# Patient Record
Sex: Male | Born: 1965 | Race: White | Hispanic: No | Smoking: Current every day smoker
Health system: Southern US, Community
[De-identification: ages and names within clinical notes are randomized; demographics above are authoritative.]

## PROBLEM LIST (undated history)

## (undated) DIAGNOSIS — K649 Unspecified hemorrhoids: Secondary | ICD-10-CM

## (undated) DIAGNOSIS — I10 Essential (primary) hypertension: Secondary | ICD-10-CM

## (undated) DIAGNOSIS — Z720 Tobacco use: Secondary | ICD-10-CM

## (undated) DIAGNOSIS — E785 Hyperlipidemia, unspecified: Secondary | ICD-10-CM

## (undated) DIAGNOSIS — F1910 Other psychoactive substance abuse, uncomplicated: Secondary | ICD-10-CM

## (undated) DIAGNOSIS — F319 Bipolar disorder, unspecified: Secondary | ICD-10-CM

## (undated) DIAGNOSIS — G4730 Sleep apnea, unspecified: Secondary | ICD-10-CM

## (undated) DIAGNOSIS — F4310 Post-traumatic stress disorder, unspecified: Secondary | ICD-10-CM

## (undated) DIAGNOSIS — J302 Other seasonal allergic rhinitis: Secondary | ICD-10-CM

## (undated) HISTORY — PX: COLONOSCOPY: SHX174

## (undated) HISTORY — DX: Essential (primary) hypertension: I10

## (undated) HISTORY — DX: Unspecified hemorrhoids: K64.9

## (undated) HISTORY — PX: KNEE SURGERY: SHX244

## (undated) HISTORY — PX: CHOLECYSTECTOMY: SHX55

## (undated) HISTORY — DX: Hyperlipidemia, unspecified: E78.5

## (undated) HISTORY — DX: Other psychoactive substance abuse, uncomplicated: F19.10

## (undated) HISTORY — PX: BACK SURGERY: SHX140

## (undated) HISTORY — DX: Other seasonal allergic rhinitis: J30.2

## (undated) HISTORY — PX: CYSTECTOMY: SUR359

## (undated) HISTORY — DX: Post-traumatic stress disorder, unspecified: F43.10

## (undated) HISTORY — DX: Tobacco use: Z72.0

## (undated) HISTORY — PX: FOOT SURGERY: SHX648

---

## 2008-09-09 ENCOUNTER — Emergency Department: Payer: Self-pay | Admitting: Emergency Medicine

## 2011-03-15 DIAGNOSIS — Z79899 Other long term (current) drug therapy: Secondary | ICD-10-CM | POA: Diagnosis not present

## 2011-03-16 ENCOUNTER — Emergency Department: Payer: Self-pay | Admitting: Emergency Medicine

## 2011-03-16 DIAGNOSIS — S93409A Sprain of unspecified ligament of unspecified ankle, initial encounter: Secondary | ICD-10-CM | POA: Diagnosis not present

## 2011-03-16 DIAGNOSIS — M129 Arthropathy, unspecified: Secondary | ICD-10-CM | POA: Diagnosis not present

## 2011-03-16 DIAGNOSIS — M545 Low back pain, unspecified: Secondary | ICD-10-CM | POA: Diagnosis not present

## 2011-03-16 DIAGNOSIS — S93609A Unspecified sprain of unspecified foot, initial encounter: Secondary | ICD-10-CM | POA: Diagnosis not present

## 2011-03-16 DIAGNOSIS — G8929 Other chronic pain: Secondary | ICD-10-CM | POA: Diagnosis not present

## 2011-03-16 DIAGNOSIS — M79609 Pain in unspecified limb: Secondary | ICD-10-CM | POA: Diagnosis not present

## 2011-04-01 DIAGNOSIS — F988 Other specified behavioral and emotional disorders with onset usually occurring in childhood and adolescence: Secondary | ICD-10-CM | POA: Diagnosis not present

## 2011-04-01 DIAGNOSIS — F319 Bipolar disorder, unspecified: Secondary | ICD-10-CM | POA: Diagnosis not present

## 2011-04-01 DIAGNOSIS — F4310 Post-traumatic stress disorder, unspecified: Secondary | ICD-10-CM | POA: Diagnosis not present

## 2011-04-01 DIAGNOSIS — Z79899 Other long term (current) drug therapy: Secondary | ICD-10-CM | POA: Diagnosis not present

## 2011-06-04 DIAGNOSIS — Z79899 Other long term (current) drug therapy: Secondary | ICD-10-CM | POA: Diagnosis not present

## 2011-06-04 DIAGNOSIS — F4310 Post-traumatic stress disorder, unspecified: Secondary | ICD-10-CM | POA: Diagnosis not present

## 2011-06-04 DIAGNOSIS — F988 Other specified behavioral and emotional disorders with onset usually occurring in childhood and adolescence: Secondary | ICD-10-CM | POA: Diagnosis not present

## 2011-06-04 DIAGNOSIS — F319 Bipolar disorder, unspecified: Secondary | ICD-10-CM | POA: Diagnosis not present

## 2011-07-03 DIAGNOSIS — F329 Major depressive disorder, single episode, unspecified: Secondary | ICD-10-CM | POA: Diagnosis not present

## 2011-07-03 DIAGNOSIS — Z0000 Encounter for general adult medical examination without abnormal findings: Secondary | ICD-10-CM | POA: Diagnosis not present

## 2011-07-03 DIAGNOSIS — R5381 Other malaise: Secondary | ICD-10-CM | POA: Diagnosis not present

## 2011-07-03 DIAGNOSIS — Z125 Encounter for screening for malignant neoplasm of prostate: Secondary | ICD-10-CM | POA: Diagnosis not present

## 2011-07-03 DIAGNOSIS — Z6829 Body mass index (BMI) 29.0-29.9, adult: Secondary | ICD-10-CM | POA: Diagnosis not present

## 2011-07-03 DIAGNOSIS — Z13220 Encounter for screening for lipoid disorders: Secondary | ICD-10-CM | POA: Diagnosis not present

## 2011-07-03 DIAGNOSIS — R5383 Other fatigue: Secondary | ICD-10-CM | POA: Diagnosis not present

## 2011-07-03 DIAGNOSIS — Z136 Encounter for screening for cardiovascular disorders: Secondary | ICD-10-CM | POA: Diagnosis not present

## 2011-07-09 DIAGNOSIS — Z5181 Encounter for therapeutic drug level monitoring: Secondary | ICD-10-CM | POA: Diagnosis not present

## 2011-07-09 DIAGNOSIS — F4310 Post-traumatic stress disorder, unspecified: Secondary | ICD-10-CM | POA: Diagnosis not present

## 2011-07-09 DIAGNOSIS — F259 Schizoaffective disorder, unspecified: Secondary | ICD-10-CM | POA: Diagnosis not present

## 2011-07-09 DIAGNOSIS — Z79899 Other long term (current) drug therapy: Secondary | ICD-10-CM | POA: Diagnosis not present

## 2011-07-29 DIAGNOSIS — Z79899 Other long term (current) drug therapy: Secondary | ICD-10-CM | POA: Diagnosis not present

## 2011-08-07 DIAGNOSIS — Z79899 Other long term (current) drug therapy: Secondary | ICD-10-CM | POA: Diagnosis not present

## 2011-08-19 DIAGNOSIS — Z79899 Other long term (current) drug therapy: Secondary | ICD-10-CM | POA: Diagnosis not present

## 2011-09-05 DIAGNOSIS — F988 Other specified behavioral and emotional disorders with onset usually occurring in childhood and adolescence: Secondary | ICD-10-CM | POA: Diagnosis not present

## 2011-09-05 DIAGNOSIS — F319 Bipolar disorder, unspecified: Secondary | ICD-10-CM | POA: Diagnosis not present

## 2011-09-05 DIAGNOSIS — F4310 Post-traumatic stress disorder, unspecified: Secondary | ICD-10-CM | POA: Diagnosis not present

## 2011-09-06 DIAGNOSIS — Z79899 Other long term (current) drug therapy: Secondary | ICD-10-CM | POA: Diagnosis not present

## 2011-10-02 DIAGNOSIS — Z79899 Other long term (current) drug therapy: Secondary | ICD-10-CM | POA: Diagnosis not present

## 2011-11-08 DIAGNOSIS — Z79899 Other long term (current) drug therapy: Secondary | ICD-10-CM | POA: Diagnosis not present

## 2011-11-08 DIAGNOSIS — F4310 Post-traumatic stress disorder, unspecified: Secondary | ICD-10-CM | POA: Diagnosis not present

## 2011-11-08 DIAGNOSIS — F319 Bipolar disorder, unspecified: Secondary | ICD-10-CM | POA: Diagnosis not present

## 2011-11-08 DIAGNOSIS — F988 Other specified behavioral and emotional disorders with onset usually occurring in childhood and adolescence: Secondary | ICD-10-CM | POA: Diagnosis not present

## 2011-12-11 DIAGNOSIS — Z79899 Other long term (current) drug therapy: Secondary | ICD-10-CM | POA: Diagnosis not present

## 2011-12-31 DIAGNOSIS — F988 Other specified behavioral and emotional disorders with onset usually occurring in childhood and adolescence: Secondary | ICD-10-CM | POA: Diagnosis not present

## 2011-12-31 DIAGNOSIS — Z79899 Other long term (current) drug therapy: Secondary | ICD-10-CM | POA: Diagnosis not present

## 2011-12-31 DIAGNOSIS — F4310 Post-traumatic stress disorder, unspecified: Secondary | ICD-10-CM | POA: Diagnosis not present

## 2011-12-31 DIAGNOSIS — F319 Bipolar disorder, unspecified: Secondary | ICD-10-CM | POA: Diagnosis not present

## 2012-01-01 DIAGNOSIS — Z79899 Other long term (current) drug therapy: Secondary | ICD-10-CM | POA: Diagnosis not present

## 2012-01-27 ENCOUNTER — Ambulatory Visit: Payer: Self-pay | Admitting: Family Medicine

## 2012-01-27 DIAGNOSIS — M2550 Pain in unspecified joint: Secondary | ICD-10-CM | POA: Diagnosis not present

## 2012-01-27 DIAGNOSIS — F4310 Post-traumatic stress disorder, unspecified: Secondary | ICD-10-CM | POA: Diagnosis not present

## 2012-01-27 DIAGNOSIS — K625 Hemorrhage of anus and rectum: Secondary | ICD-10-CM | POA: Diagnosis not present

## 2012-01-27 DIAGNOSIS — E785 Hyperlipidemia, unspecified: Secondary | ICD-10-CM | POA: Diagnosis not present

## 2012-01-27 DIAGNOSIS — Z23 Encounter for immunization: Secondary | ICD-10-CM | POA: Diagnosis not present

## 2012-01-27 DIAGNOSIS — M169 Osteoarthritis of hip, unspecified: Secondary | ICD-10-CM | POA: Diagnosis not present

## 2012-01-27 DIAGNOSIS — M24159 Other articular cartilage disorders, unspecified hip: Secondary | ICD-10-CM | POA: Diagnosis not present

## 2012-04-20 DIAGNOSIS — F319 Bipolar disorder, unspecified: Secondary | ICD-10-CM | POA: Diagnosis not present

## 2012-04-20 DIAGNOSIS — Z79899 Other long term (current) drug therapy: Secondary | ICD-10-CM | POA: Diagnosis not present

## 2012-06-01 DIAGNOSIS — E785 Hyperlipidemia, unspecified: Secondary | ICD-10-CM | POA: Diagnosis not present

## 2012-06-01 DIAGNOSIS — I10 Essential (primary) hypertension: Secondary | ICD-10-CM | POA: Diagnosis not present

## 2012-06-01 DIAGNOSIS — F17200 Nicotine dependence, unspecified, uncomplicated: Secondary | ICD-10-CM | POA: Diagnosis not present

## 2012-06-18 DIAGNOSIS — M898X9 Other specified disorders of bone, unspecified site: Secondary | ICD-10-CM | POA: Diagnosis not present

## 2012-06-18 DIAGNOSIS — M2040 Other hammer toe(s) (acquired), unspecified foot: Secondary | ICD-10-CM | POA: Diagnosis not present

## 2012-06-25 DIAGNOSIS — L219 Seborrheic dermatitis, unspecified: Secondary | ICD-10-CM | POA: Diagnosis not present

## 2012-06-25 DIAGNOSIS — D179 Benign lipomatous neoplasm, unspecified: Secondary | ICD-10-CM | POA: Diagnosis not present

## 2012-06-25 DIAGNOSIS — D170 Benign lipomatous neoplasm of skin and subcutaneous tissue of head, face and neck: Secondary | ICD-10-CM | POA: Diagnosis not present

## 2012-07-06 ENCOUNTER — Ambulatory Visit: Payer: Self-pay | Admitting: Podiatry

## 2012-07-06 DIAGNOSIS — F319 Bipolar disorder, unspecified: Secondary | ICD-10-CM | POA: Diagnosis not present

## 2012-07-06 DIAGNOSIS — F17200 Nicotine dependence, unspecified, uncomplicated: Secondary | ICD-10-CM | POA: Diagnosis not present

## 2012-07-06 DIAGNOSIS — M2040 Other hammer toe(s) (acquired), unspecified foot: Secondary | ICD-10-CM | POA: Diagnosis not present

## 2012-07-06 DIAGNOSIS — M898X9 Other specified disorders of bone, unspecified site: Secondary | ICD-10-CM | POA: Diagnosis not present

## 2012-07-06 DIAGNOSIS — K219 Gastro-esophageal reflux disease without esophagitis: Secondary | ICD-10-CM | POA: Diagnosis not present

## 2012-07-06 DIAGNOSIS — I10 Essential (primary) hypertension: Secondary | ICD-10-CM | POA: Diagnosis not present

## 2012-07-06 DIAGNOSIS — Z01812 Encounter for preprocedural laboratory examination: Secondary | ICD-10-CM | POA: Diagnosis not present

## 2012-07-06 LAB — CBC WITH DIFFERENTIAL/PLATELET
Basophil #: 0.1 10*3/uL (ref 0.0–0.1)
Basophil %: 0.9 %
Lymphocyte #: 3.4 10*3/uL (ref 1.0–3.6)
MCH: 31.6 pg (ref 26.0–34.0)
MCHC: 34.6 g/dL (ref 32.0–36.0)
MCV: 91 fL (ref 80–100)
Monocyte #: 0.8 x10 3/mm (ref 0.2–1.0)
Monocyte %: 7.6 %
Neutrophil #: 6.1 10*3/uL (ref 1.4–6.5)
Neutrophil %: 58 %
Platelet: 324 10*3/uL (ref 150–440)
RDW: 13.1 % (ref 11.5–14.5)

## 2012-07-10 ENCOUNTER — Ambulatory Visit: Payer: Self-pay | Admitting: Podiatry

## 2012-07-10 DIAGNOSIS — K219 Gastro-esophageal reflux disease without esophagitis: Secondary | ICD-10-CM | POA: Diagnosis not present

## 2012-07-10 DIAGNOSIS — F319 Bipolar disorder, unspecified: Secondary | ICD-10-CM | POA: Diagnosis not present

## 2012-07-10 DIAGNOSIS — M2040 Other hammer toe(s) (acquired), unspecified foot: Secondary | ICD-10-CM | POA: Diagnosis not present

## 2012-07-10 DIAGNOSIS — I10 Essential (primary) hypertension: Secondary | ICD-10-CM | POA: Diagnosis not present

## 2012-07-10 DIAGNOSIS — F4310 Post-traumatic stress disorder, unspecified: Secondary | ICD-10-CM | POA: Diagnosis not present

## 2012-07-10 DIAGNOSIS — F17200 Nicotine dependence, unspecified, uncomplicated: Secondary | ICD-10-CM | POA: Diagnosis not present

## 2012-07-10 DIAGNOSIS — M898X9 Other specified disorders of bone, unspecified site: Secondary | ICD-10-CM | POA: Diagnosis not present

## 2012-09-02 DIAGNOSIS — E785 Hyperlipidemia, unspecified: Secondary | ICD-10-CM | POA: Diagnosis not present

## 2012-09-02 DIAGNOSIS — D179 Benign lipomatous neoplasm, unspecified: Secondary | ICD-10-CM | POA: Diagnosis not present

## 2012-09-02 DIAGNOSIS — I10 Essential (primary) hypertension: Secondary | ICD-10-CM | POA: Diagnosis not present

## 2012-09-02 DIAGNOSIS — K625 Hemorrhage of anus and rectum: Secondary | ICD-10-CM | POA: Diagnosis not present

## 2013-03-16 IMAGING — CR RIGHT HIP - COMPLETE 2+ VIEW
1 series · 2 of 2 positions shown · non-contrast
Comparison: none

REASON FOR EXAM: pain in joint arthraigia
COMMENTS:

PROCEDURE:     ZBIGNIEW - ZBIGNIEW HIP RIGHT COMPLETE  - January 27, 2012 [DATE]
RESULT:     Comparison: None.

[Series 1: ap · 0.17mm/px · 2 of 2 slices shown]
[im 1/2]
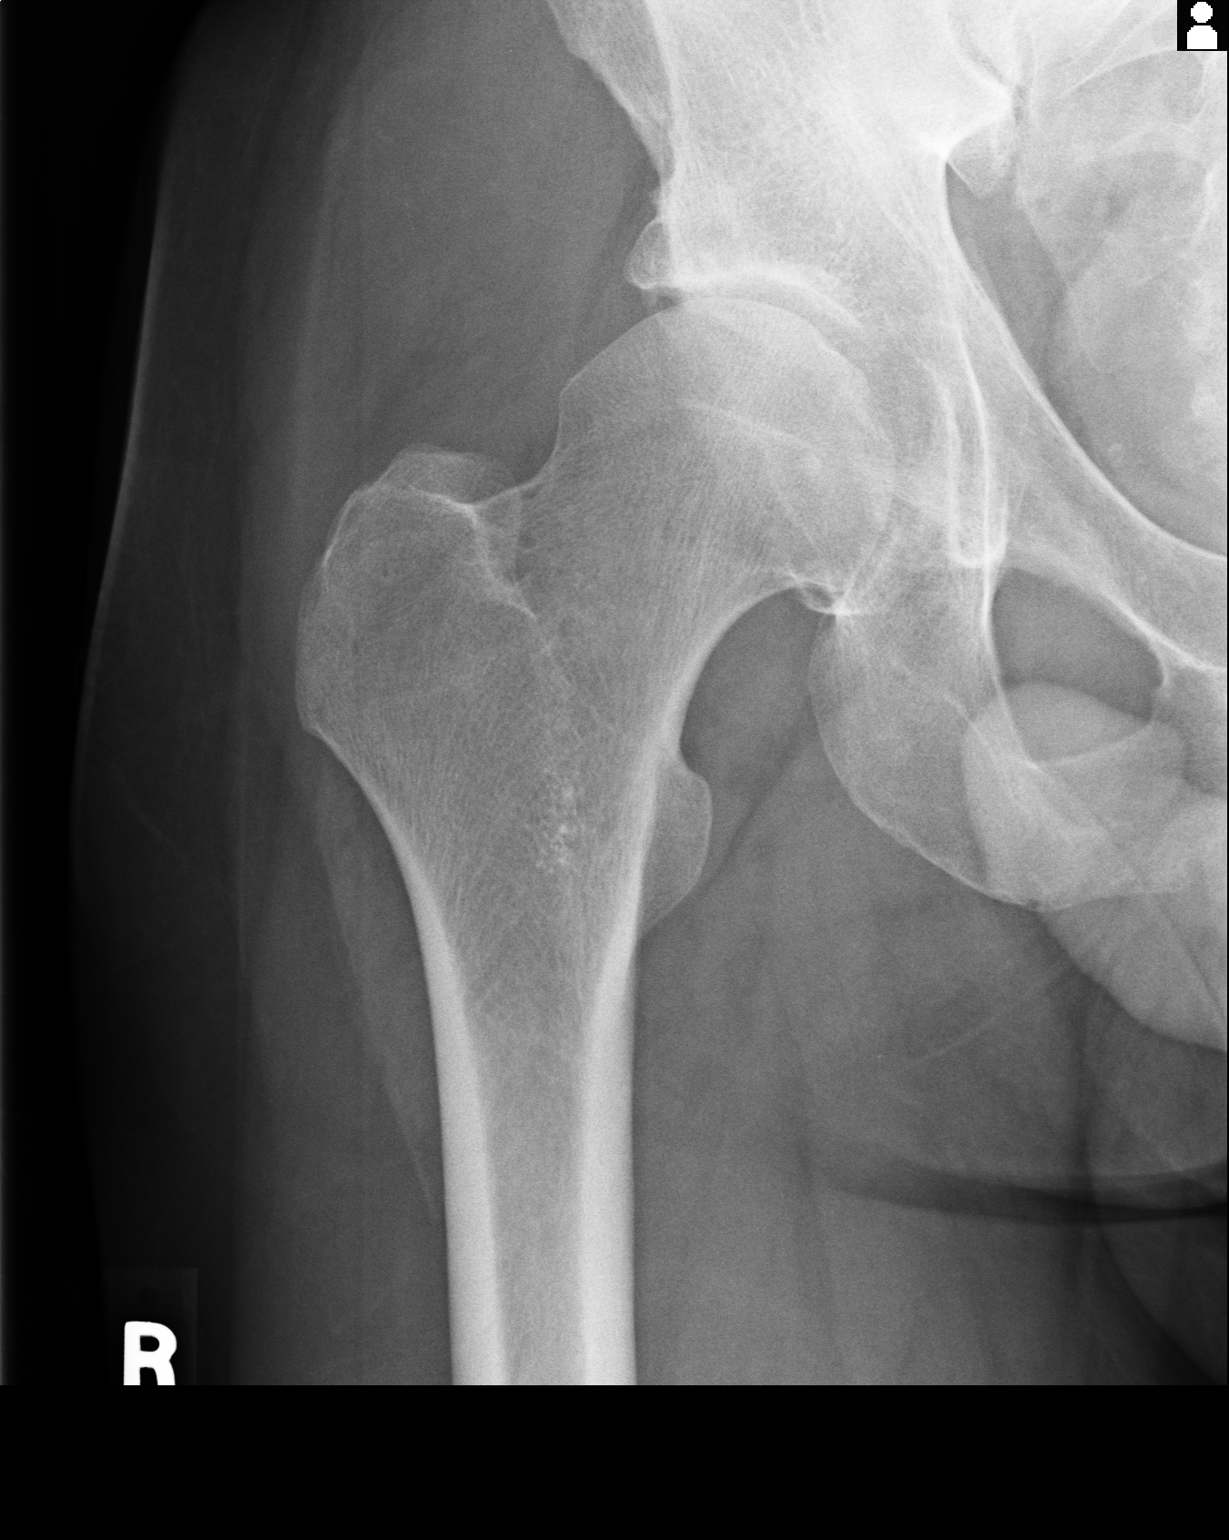
[im 2/2]
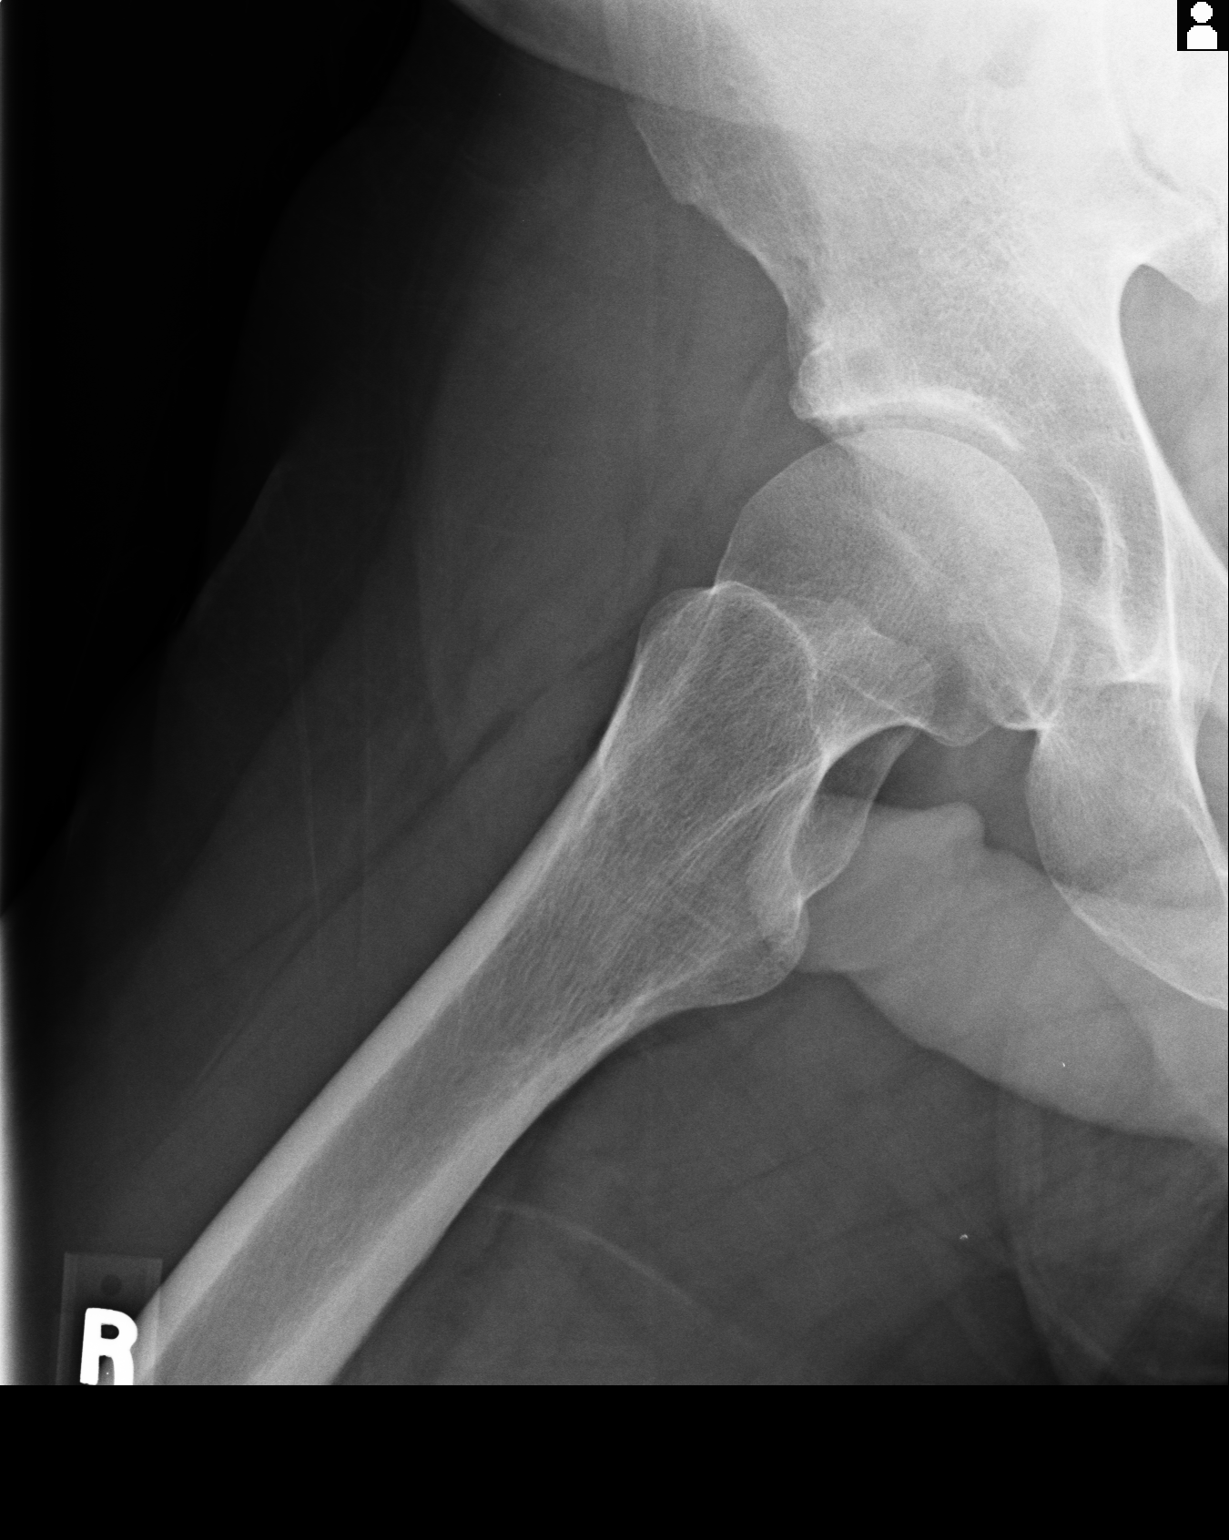

[2 of 2 positions shown; findings below may reference images not displayed]

FINDINGS: There is mild osteophytosis of the right femoral head. There is minimal
joint space loss of the right hip.
IMPRESSION: Mild degenerative change of the right hip.

[REDACTED]

## 2013-09-20 DIAGNOSIS — G8929 Other chronic pain: Secondary | ICD-10-CM | POA: Diagnosis not present

## 2013-09-20 DIAGNOSIS — R197 Diarrhea, unspecified: Secondary | ICD-10-CM | POA: Diagnosis not present

## 2013-09-20 DIAGNOSIS — F4310 Post-traumatic stress disorder, unspecified: Secondary | ICD-10-CM | POA: Diagnosis not present

## 2013-09-20 DIAGNOSIS — M549 Dorsalgia, unspecified: Secondary | ICD-10-CM | POA: Diagnosis not present

## 2013-10-07 ENCOUNTER — Ambulatory Visit: Payer: Self-pay | Admitting: Urgent Care

## 2013-10-07 DIAGNOSIS — I700 Atherosclerosis of aorta: Secondary | ICD-10-CM | POA: Diagnosis not present

## 2013-10-07 DIAGNOSIS — R197 Diarrhea, unspecified: Secondary | ICD-10-CM | POA: Diagnosis not present

## 2013-10-07 DIAGNOSIS — R109 Unspecified abdominal pain: Secondary | ICD-10-CM | POA: Diagnosis not present

## 2013-10-07 DIAGNOSIS — M47817 Spondylosis without myelopathy or radiculopathy, lumbosacral region: Secondary | ICD-10-CM | POA: Diagnosis not present

## 2013-10-07 LAB — COMPREHENSIVE METABOLIC PANEL
ALBUMIN: 3.3 g/dL — AB (ref 3.4–5.0)
ALK PHOS: 103 U/L
ALT: 26 U/L
ANION GAP: 5 — AB (ref 7–16)
BUN: 5 mg/dL — ABNORMAL LOW (ref 7–18)
Bilirubin,Total: 0.4 mg/dL (ref 0.2–1.0)
Calcium, Total: 8.7 mg/dL (ref 8.5–10.1)
Chloride: 108 mmol/L — ABNORMAL HIGH (ref 98–107)
Co2: 29 mmol/L (ref 21–32)
Creatinine: 0.95 mg/dL (ref 0.60–1.30)
EGFR (African American): >60
EGFR (Non-African Amer.): >60
Glucose: 91 mg/dL (ref 65–99)
Osmolality: 280 (ref 275–301)
POTASSIUM: 4.1 mmol/L (ref 3.5–5.1)
SGOT(AST): 22 U/L (ref 15–37)
Sodium: 142 mmol/L (ref 136–145)
Total Protein: 7.3 g/dL (ref 6.4–8.2)

## 2013-10-07 LAB — CBC WITH DIFFERENTIAL/PLATELET
Basophil #: 0.1 10*3/uL (ref 0.0–0.1)
Basophil %: 1.2 %
EOS ABS: 0.2 10*3/uL (ref 0.0–0.7)
Eosinophil %: 1.7 %
HCT: 42.2 % (ref 40.0–52.0)
HGB: 13.9 g/dL (ref 13.0–18.0)
LYMPHS ABS: 3.1 10*3/uL (ref 1.0–3.6)
Lymphocyte %: 33.2 %
MCH: 30 pg (ref 26.0–34.0)
MCHC: 33 g/dL (ref 32.0–36.0)
MCV: 91 fL (ref 80–100)
MONO ABS: 0.6 x10 3/mm (ref 0.2–1.0)
Monocyte %: 6.3 %
NEUTROS ABS: 5.5 10*3/uL (ref 1.4–6.5)
Neutrophil %: 57.6 %
Platelet: 328 10*3/uL (ref 150–440)
RBC: 4.64 10*6/uL (ref 4.40–5.90)
RDW: 14.3 % (ref 11.5–14.5)
WBC: 9.5 10*3/uL (ref 3.8–10.6)

## 2013-10-07 LAB — TSH: THYROID STIMULATING HORM: 1.02 u[IU]/mL

## 2013-10-07 LAB — LIPASE, BLOOD: Lipase: 109 U/L (ref 73–393)

## 2013-10-18 DIAGNOSIS — M549 Dorsalgia, unspecified: Secondary | ICD-10-CM | POA: Diagnosis not present

## 2013-10-18 DIAGNOSIS — G8929 Other chronic pain: Secondary | ICD-10-CM | POA: Diagnosis not present

## 2013-10-18 DIAGNOSIS — T148 Other injury of unspecified body region: Secondary | ICD-10-CM | POA: Diagnosis not present

## 2013-11-17 DIAGNOSIS — R079 Chest pain, unspecified: Secondary | ICD-10-CM | POA: Diagnosis not present

## 2013-11-17 DIAGNOSIS — F4310 Post-traumatic stress disorder, unspecified: Secondary | ICD-10-CM | POA: Diagnosis not present

## 2013-11-17 DIAGNOSIS — E785 Hyperlipidemia, unspecified: Secondary | ICD-10-CM | POA: Diagnosis not present

## 2013-11-17 DIAGNOSIS — I10 Essential (primary) hypertension: Secondary | ICD-10-CM | POA: Diagnosis not present

## 2013-11-18 DIAGNOSIS — Z6828 Body mass index (BMI) 28.0-28.9, adult: Secondary | ICD-10-CM | POA: Diagnosis not present

## 2013-11-18 DIAGNOSIS — M545 Low back pain, unspecified: Secondary | ICD-10-CM | POA: Diagnosis not present

## 2013-12-02 DIAGNOSIS — M549 Dorsalgia, unspecified: Secondary | ICD-10-CM | POA: Diagnosis not present

## 2013-12-09 DIAGNOSIS — M545 Low back pain: Secondary | ICD-10-CM | POA: Diagnosis not present

## 2013-12-16 ENCOUNTER — Encounter: Payer: Self-pay | Admitting: *Deleted

## 2013-12-17 ENCOUNTER — Ambulatory Visit (INDEPENDENT_AMBULATORY_CARE_PROVIDER_SITE_OTHER): Payer: Medicare Other | Admitting: Cardiovascular Disease

## 2013-12-17 ENCOUNTER — Encounter: Payer: Self-pay | Admitting: Cardiovascular Disease

## 2013-12-17 VITALS — BP 124/78 | HR 69 | Ht 69.0 in | Wt 192.5 lb

## 2013-12-17 DIAGNOSIS — Z01818 Encounter for other preprocedural examination: Secondary | ICD-10-CM

## 2013-12-17 DIAGNOSIS — R0602 Shortness of breath: Secondary | ICD-10-CM

## 2013-12-17 DIAGNOSIS — Z720 Tobacco use: Secondary | ICD-10-CM | POA: Diagnosis not present

## 2013-12-17 DIAGNOSIS — E785 Hyperlipidemia, unspecified: Secondary | ICD-10-CM

## 2013-12-17 DIAGNOSIS — R079 Chest pain, unspecified: Secondary | ICD-10-CM | POA: Diagnosis not present

## 2013-12-17 NOTE — Assessment & Plan Note (Signed)
I advised the patient to quit smoking

## 2013-12-17 NOTE — Assessment & Plan Note (Signed)
The patient has not been taking Crestor on a regular basis.

## 2013-12-17 NOTE — Procedures (Signed)
   Treadmill Stress test  Indication: Chest pain.  Baseline Data:  Resting EKG shows NSR with rate of 74 bpm, no significant ST or T wave changes. Resting blood pressure of 105/65 mm Hg Stand bruce protocal was used.  Exercise Data:  Patient exercised for 9 min 0 sec,  Peak heart rate of 126 bpm.  This was 74 % of the maximum predicted heart rate. The patient complained of mild chest pain at peak exercise which resolved after a few minutes  in recovery.  Peak Blood pressure recorded was 153/67 Maximal work level: 10.10 METs.  Heart rate at 3 minutes in recovery was 80 bpm. BP response: Normal HR response: Blunted  EKG with Exercise: Sinus tachycardia with 0.5 mm of horizontal ST depression in the inferior leads as well as V5 and V6  FINAL IMPRESSION: Nondiagnostic exercise stress test due to inability to achieve 85% Predicted heart rate. Borderline EKG changes concerning for ischemia with mild exercise induced chest discomfort. Average exercise tolerance.  Recommendation: Pharmacologic nuclear stress test.

## 2013-12-17 NOTE — Patient Instructions (Addendum)
Your physician has requested that you have an exercise tolerance test today. For further information please visit [email or web address]. Please also follow instruction sheet, as given.   [place]  Your caregiver has ordered a Stress Test with nuclear imaging. The purpose of this test is to evaluate the blood supply to your heart muscle. This procedure is referred to as a "Non-Invasive Stress Test." This is because other than having an IV started in your vein, nothing is inserted or "invades" your body. Cardiac stress tests are done to find areas of poor blood flow to the heart by determining the extent of coronary artery disease (CAD). Some patients exercise on a treadmill, which naturally increases the blood flow to your heart, while others who are  unable to walk on a treadmill due to physical limitations have a pharmacologic/chemical stress agent called Lexiscan . This medicine will mimic walking on a treadmill by temporarily increasing your coronary blood flow.   Please note: these test may take anywhere between 2-4 hours to complete  PLEASE REPORT TO [facility] AT THE FIRST DESK WILL DIRECT YOU WHERE TO GO  Date of Procedure:______10/14/15_______________________________  Arrival Time for Procedure:______0715 am________________________  PLEASE NOTIFY THE OFFICE AT LEAST 24 HOURS IN ADVANCE IF YOU ARE UNABLE TO KEEP YOUR APPOINTMENT.  [phone] AND  PLEASE NOTIFY NUCLEAR MEDICINE AT [facility] AT LEAST 24 HOURS IN ADVANCE IF YOU ARE UNABLE TO KEEP YOUR APPOINTMENT. [phone]  How to prepare for your Myoview test:  1. Do not eat or drink after midnight 2. No caffeine for 24 hours prior to test 3. No smoking 24 hours prior to test. 4. Your medication may be taken with water.  If your doctor stopped a medication because of this test, do not take that medication. 5. Ladies, please do not wear dresses.  Skirts or pants are appropriate. Please wear a short  sleeve shirt. 6. No perfume, cologne or lotion. 7. Wear comfortable walking shoes. No heels!

## 2013-12-17 NOTE — Assessment & Plan Note (Addendum)
The patient has multiple risk factors for coronary artery disease. He cannot describe his chest pain accurately. Cardiac exam is unremarkable and baseline ECG is normal. I proceeded with a treadmill stress test. However, he was only able to achieve 74% of maximum predicted heart rate. He had mild chest discomfort with exercise and borderline ECG changes. Thus, this was a nondiagnostic test. I scheduled the patient for a pharmacologic nuclear stress test.

## 2013-12-17 NOTE — Patient Instructions (Addendum)
Schedule nuclear stress test

## 2013-12-17 NOTE — Progress Notes (Signed)
primary care physician: Dr. Luan Pulling  HPI  This is a 48 year old male who was referred for evaluation of chest pain and preoperative evaluation before colonoscopy. He has no previous cardiac history. He has prolonged history of excessive tobacco use, anxiety, PTSD and hyperlipidemia. He uses marijuana on a regular basis to treat his anxiety. It's also mentions that he uses cocaine. However, this is denied by the patient. The patient was noted to have blood in stool and is associated colonoscopy done. However, he has been complaining of intermittent chest pain which he cannot describe. These usually last for a few minutes and it happens if he smokes too much according to him and sometimes with physical activities. This has been associated with shortness of breath. He does have hyperlipidemia but does not think Crestor on a regular basis as he is supposed to. He has no family history of premature coronary artery disease. He is currently on disability.  No Known Allergies   Current Outpatient Prescriptions on File Prior to Visit  Medication Sig Dispense Refill  . sildenafil (VIAGRA) 50 MG tablet Take 50 mg by mouth daily as needed for erectile dysfunction.       No current facility-administered medications on file prior to visit.     Past Medical History  Diagnosis Date  . Hemorrhoids   . Hypertension   . PTSD (post-traumatic stress disorder)   . Seasonal allergies   . Dyslipidemia   . Hyperlipidemia   . Tobacco use      Past Surgical History  Procedure Laterality Date  . Colonoscopy    . Back surgery    . Cholecystectomy    . Knee surgery       History reviewed. No pertinent family history.   History   Social History  . Marital Status: Single    Spouse Name: N/A    Number of Children: N/A  . Years of Education: N/A   Occupational History  . Not on file.   Social History Main Topics  . Smoking status: Current Every Day Smoker -- 20 years    Types: Cigarettes  .  Smokeless tobacco: Not on file  . Alcohol Use: No  . Drug Use: Yes    Special: Marijuana, Cocaine  . Sexual Activity: Not on file   Other Topics Concern  . Not on file   Social History Narrative  . No narrative on file     ROS A 10 point review of system was performed. It is negative other than that mentioned in the history of present illness.   PHYSICAL EXAM   BP 124/78  Pulse 69  Ht 5\' 9"  (1.753 m)  Wt 192 lb 8 oz (87.317 kg)  BMI 28.41 kg/m2 Constitutional: He is oriented to person, place, and time. He appears well-developed and well-nourished. No distress.  HENT: No nasal discharge.  Head: Normocephalic and atraumatic.  Eyes: Pupils are equal and round.  No discharge. Neck: Normal range of motion. Neck supple. No JVD present. No thyromegaly present.  Cardiovascular: Normal rate, regular rhythm, normal heart sounds. Exam reveals no gallop and no friction rub. No murmur heard.  Pulmonary/Chest: Effort normal and breath sounds normal. No stridor. No respiratory distress. He has no wheezes. He has no rales. He exhibits no tenderness.  Abdominal: Soft. Bowel sounds are normal. He exhibits no distension. There is no tenderness. There is no rebound and no guarding.  Musculoskeletal: Normal range of motion. He exhibits no edema and no tenderness.  Neurological: He  is alert and oriented to person, place, and time. Coordination normal.  Skin: Skin is warm and dry. No rash noted. He is not diaphoretic. No erythema. No pallor.  Psychiatric: He has a normal mood and affect. His behavior is normal. Judgment and thought content normal.       EKG: Sinus  Rhythm  WITHIN NORMAL LIMITS   ASSESSMENT AND PLAN

## 2013-12-22 ENCOUNTER — Ambulatory Visit: Payer: Self-pay | Admitting: Cardiovascular Disease

## 2013-12-22 DIAGNOSIS — R079 Chest pain, unspecified: Secondary | ICD-10-CM | POA: Diagnosis not present

## 2013-12-23 ENCOUNTER — Telehealth: Payer: Self-pay

## 2013-12-23 ENCOUNTER — Other Ambulatory Visit: Payer: Self-pay

## 2013-12-23 DIAGNOSIS — R0602 Shortness of breath: Secondary | ICD-10-CM

## 2013-12-23 DIAGNOSIS — R079 Chest pain, unspecified: Secondary | ICD-10-CM

## 2013-12-23 NOTE — Telephone Encounter (Signed)
See result note.  

## 2013-12-23 NOTE — Telephone Encounter (Signed)
Pt would like to know if he is able to have his colonoscopy, states he had a stress test yesterday

## 2014-01-03 ENCOUNTER — Ambulatory Visit: Payer: Medicare Other | Admitting: Cardiovascular Disease

## 2014-01-03 ENCOUNTER — Telehealth: Payer: Self-pay | Admitting: Cardiovascular Disease

## 2014-01-03 NOTE — Telephone Encounter (Signed)
Pt is upset and wants to know why he is being seen again, for he has done the apt for clearance for colon surgery. Told pt that dr.hawkins wanted to do the apt, but he still thinks it was [place]. Said that we are just making these apt so we can get paid. That he is doing all this and there is nothing being said about his surgery. Suggested that he call dr.hawkins office and see why they set this apt up. Please call him.

## 2014-01-03 NOTE — Telephone Encounter (Signed)
Informed patient that per Dr. Fletcher Anon he does not need to come to his follow up  He will need to contact Dr. Luan Pulling office to follow up on colonoscopy

## 2014-01-07 ENCOUNTER — Ambulatory Visit: Payer: Medicare Other | Admitting: Cardiovascular Disease

## 2014-01-17 DIAGNOSIS — F4310 Post-traumatic stress disorder, unspecified: Secondary | ICD-10-CM | POA: Diagnosis not present

## 2014-01-17 DIAGNOSIS — Z79899 Other long term (current) drug therapy: Secondary | ICD-10-CM | POA: Diagnosis not present

## 2014-01-17 DIAGNOSIS — F319 Bipolar disorder, unspecified: Secondary | ICD-10-CM | POA: Diagnosis not present

## 2014-05-27 DIAGNOSIS — F259 Schizoaffective disorder, unspecified: Secondary | ICD-10-CM | POA: Diagnosis not present

## 2014-06-07 DIAGNOSIS — F1220 Cannabis dependence, uncomplicated: Secondary | ICD-10-CM | POA: Diagnosis not present

## 2014-06-07 DIAGNOSIS — F259 Schizoaffective disorder, unspecified: Secondary | ICD-10-CM | POA: Diagnosis not present

## 2014-06-07 DIAGNOSIS — F4310 Post-traumatic stress disorder, unspecified: Secondary | ICD-10-CM | POA: Diagnosis not present

## 2014-06-10 DIAGNOSIS — F3162 Bipolar disorder, current episode mixed, moderate: Secondary | ICD-10-CM | POA: Diagnosis not present

## 2014-06-10 DIAGNOSIS — F4310 Post-traumatic stress disorder, unspecified: Secondary | ICD-10-CM | POA: Diagnosis not present

## 2014-06-10 DIAGNOSIS — I10 Essential (primary) hypertension: Secondary | ICD-10-CM | POA: Diagnosis not present

## 2014-06-10 DIAGNOSIS — E785 Hyperlipidemia, unspecified: Secondary | ICD-10-CM | POA: Diagnosis not present

## 2014-06-10 DIAGNOSIS — R12 Heartburn: Secondary | ICD-10-CM | POA: Diagnosis not present

## 2014-07-01 NOTE — Op Note (Signed)
PATIENT NAME:  Craig Nolan, Craig Nolan MR#:  [id number] DATE OF BIRTH:  Nov 08, 1965  DATE OF PROCEDURE:  07/10/2012  PREOPERATIVE DIAGNOSIS: Hammertoes with exostosis, right fourth and fifth toes.   POSTOPERATIVE DIAGNOSIS: Hammertoes with exostosis, right fourth and fifth toes.   PROCEDURES: Arthroplasties, right fourth and fifth toes.   SURGEON: Sharlotte Alamo, DPM.   ANESTHESIA: General endotracheal.   HEMOSTASIS: Pneumatic tourniquet, right ankle, 250 mmHg.   ESTIMATED BLOOD LOSS: Minimal.   PATHOLOGY: None.   COMPLICATIONS: None apparent.   OPERATIVE INDICATIONS: This is a 49 year old male with some chronic painful hammertoes. He has been dealing with these for years and would like to have them surgically corrected.   OPERATIVE PROCEDURE: The patient was taken to the Operating Room and placed on the table in the supine position. Following satisfactory sedation, the right foot was anesthetized with 6 mL of 0.5% Sensorcaine plain around the fourth and fifth toes. A pneumatic tourniquet was applied at the level of the right ankle and the foot was prepped and draped in the usual sterile fashion. The foot was exsanguinated and the tourniquet inflated to 250 mmHg.   Attention was then directed to the dorsal aspect of both the right fourth and fifth toes, where a linear 1.5 cm incision was made over the proximal interphalangeal joints. The incision was deepened via sharp and blunt dissection down to the level of the joint, where a transverse tenotomy was performed. Capsular and periosteal tissues were reflected off the head of the proximal phalanx on both toes. The head was then removed in toto using a pneumatic saw. Next, the capsular and periosteal tissues were reflected off of the lateral aspect of the middle phalanx on the fourth and the medial aspect of the middle phalanx on the fifth. Using a pneumatic saw, the lateralmost portion on the fourth and the medialmost portion on the fifth were then  resected and the edges were rasped smooth. Intraoperative FluoroScan views revealed good reduction of the bony deformities. The wounds were flushed with copious amounts of sterile saline and closed using 4-0 Vicryl simple interrupted suture for tendon reapproximation, followed by skin closure using 5-0 nylon simple interrupted sutures. Xeroform and sterile bandages were applied. Tourniquet was released and blood flow noted to return immediately to the right foot in digits 1 through 5. The patient tolerated the procedure and anesthesia well and was transported to the PACU with vital signs stable and in good condition.   ____________________________ Sharlotte Alamo, DPM tc:jm D: 07/10/2012 12:47:37 ET T: 07/10/2012 13:21:31 ET JOB#: [id number]  cc: Sharlotte Alamo, DPM, <Dictator> Opie Maclaughlin DPM ELECTRONICALLY SIGNED 07/16/2012 8:23

## 2014-07-20 DIAGNOSIS — E785 Hyperlipidemia, unspecified: Secondary | ICD-10-CM | POA: Diagnosis not present

## 2014-07-20 DIAGNOSIS — R12 Heartburn: Secondary | ICD-10-CM | POA: Diagnosis not present

## 2014-07-29 DIAGNOSIS — F319 Bipolar disorder, unspecified: Secondary | ICD-10-CM | POA: Diagnosis not present

## 2014-08-15 ENCOUNTER — Telehealth: Payer: Self-pay | Admitting: Family Medicine

## 2014-08-15 NOTE — Telephone Encounter (Signed)
I had recommended that he see a Psych at his last visit and he said that he would go back to see his Psych in [place], but I did not make an official referral.  He definitely needed to see a Psych and be under his care.

## 2014-08-15 NOTE — Telephone Encounter (Signed)
I do not think this is a referral. He says you advised he will need psychiatrist but no idea who and so he went on his own. The place that saw him is responsible for this appeal process if they saw him without knowing if this was approved in [place]. Do you remember advising him to see Psych? [facility]

## 2014-08-15 NOTE — Telephone Encounter (Signed)
Patient will contact Psych and request appeal. [facility]

## 2014-08-15 NOTE — Telephone Encounter (Signed)
Pt went to see Dr. Bernita Raisin, Psychiatrist at [facility] in [place] and his insurance [employer] is denying payment because he did not get referral..  He said Dr. Luan Pulling suggested he see a psychiatrist but didn't say which one so he made his own appt.  Please call

## 2014-08-16 ENCOUNTER — Telehealth: Payer: Self-pay

## 2014-08-16 NOTE — Telephone Encounter (Signed)
Advised him to contact Psych and have them start appeal for the visit that was never entered in as referral and we will help if needed. [facility]

## 2014-08-16 NOTE — Telephone Encounter (Signed)
-----   Message from Arlis Porta., MD sent at 08/15/2014  4:15 PM EDT ----- Regarding: Psych referral My last note indicated that I wanted him to see a Psych and that he would see his old Psych in [place], but an official referral was not ordered.

## 2014-08-17 ENCOUNTER — Telehealth: Payer: Self-pay

## 2014-08-17 NOTE — Telephone Encounter (Signed)
Noted  

## 2014-08-17 NOTE — Telephone Encounter (Signed)
Sharyn Lull from [facility] called stating that patient called and cancelled his Colonoscopy stating he does not care to know if something is wrong with him. This has gone back and forth for many months so Pat Patrick has cancelled referral. He will need drug screen and consult again before having another referral for Colonoscopy. He does this often and had called [place] prior to calling them stating he was rescheduling his appt at [facility]. Not sure what is going on but for now due to incompliance the referral is cancelled pending drug screen and consult.

## 2014-10-17 ENCOUNTER — Ambulatory Visit (INDEPENDENT_AMBULATORY_CARE_PROVIDER_SITE_OTHER): Payer: Commercial Managed Care - HMO | Admitting: Family Medicine

## 2014-10-17 ENCOUNTER — Encounter: Payer: Self-pay | Admitting: Family Medicine

## 2014-10-17 VITALS — BP 140/85 | HR 64 | Temp 97.5°F | Ht 68.0 in | Wt 193.0 lb

## 2014-10-17 DIAGNOSIS — G8929 Other chronic pain: Secondary | ICD-10-CM | POA: Diagnosis not present

## 2014-10-17 DIAGNOSIS — K5901 Slow transit constipation: Secondary | ICD-10-CM | POA: Diagnosis not present

## 2014-10-17 DIAGNOSIS — F312 Bipolar disorder, current episode manic severe with psychotic features: Secondary | ICD-10-CM

## 2014-10-17 DIAGNOSIS — M545 Low back pain, unspecified: Secondary | ICD-10-CM

## 2014-10-17 DIAGNOSIS — E785 Hyperlipidemia, unspecified: Secondary | ICD-10-CM

## 2014-10-17 NOTE — Progress Notes (Signed)
Name: Craig Nolan   MRN: [id number]    DOB: 28-May-1965   Date:10/17/2014       Progress Note  Subjective  Chief Complaint  Chief Complaint  Patient presents with  . Hyperlipidemia  . Constipation  . Manic Behavior    HPI  For f/u of hyperlipidemia,..  Has constipation., chronic back pain and bipolar dsorder. No problem-specific assessment & plan notes found for this encounter.   Past Medical History  Diagnosis Date  . Hemorrhoids   . Hypertension   . PTSD (post-traumatic stress disorder)   . Seasonal allergies   . Dyslipidemia   . Hyperlipidemia   . Tobacco use     History  Substance Use Topics  . Smoking status: Current Every Day Smoker -- 20 years    Types: Cigarettes  . Smokeless tobacco: Never Used  . Alcohol Use: No     Current outpatient prescriptions:  .  buPROPion (WELLBUTRIN XL) 300 MG 24 hr tablet, Take 300 mg by mouth daily., Disp: , Rfl:  .  calcium carbonate (OS-CAL) 600 MG TABS tablet, Take 600 mg by mouth daily with breakfast., Disp: , Rfl:  .  cloNIDine (CATAPRES) 0.2 MG tablet, Take 0.2 mg by mouth 2 (two) times daily as needed., Disp: , Rfl:  .  diazepam (VALIUM) 5 MG tablet, Take 5 mg by mouth 2 (two) times daily., Disp: , Rfl:  .  EPITOL 200 MG tablet, Take 200 mg by mouth daily., Disp: , Rfl:  .  Multiple Vitamin (MULTIVITAMIN) tablet, Take 1 tablet by mouth daily., Disp: , Rfl:  .  Omega-3 300 MG CAPS, Take by mouth daily., Disp: , Rfl:  .  ranitidine (ZANTAC) 150 MG tablet, Take 150 mg by mouth at bedtime., Disp: , Rfl:  .  rosuvastatin (CRESTOR) 20 MG tablet, Take 20 mg by mouth as needed., Disp: , Rfl:  .  sildenafil (VIAGRA) 50 MG tablet, Take 50 mg by mouth daily as needed for erectile dysfunction., Disp: , Rfl:  .  traMADol (ULTRAM) 50 MG tablet, Take 50 mg by mouth 2 (two) times daily., Disp: , Rfl:  .  VRAYLAR 6 MG CAPS, Take 6 mg by mouth daily., Disp: , Rfl:  .  gabapentin (NEURONTIN) 300 MG capsule, Take 1,200 mg by mouth  daily., Disp: , Rfl:  .  gabapentin (NEURONTIN) 800 MG tablet, Take 1,200 mg by mouth as needed. , Disp: , Rfl:   No Known Allergies  Review of Systems  Constitutional: Negative for fever, chills, weight loss and malaise/fatigue.  HENT: Negative for hearing loss and nosebleeds.   Eyes: Negative for blurred vision and double vision.  Respiratory: Negative for cough, sputum production, shortness of breath and wheezing.   Cardiovascular: Negative for chest pain, palpitations, orthopnea and leg swelling.  Gastrointestinal: Positive for blood in stool (anal fissure). Negative for heartburn, nausea, vomiting and abdominal pain.  Genitourinary: Negative for dysuria, urgency and frequency.  Skin: Negative for rash.  Neurological: Negative for dizziness, tingling, sensory change, focal weakness, weakness and headaches.  Psychiatric/Behavioral: Positive for depression and substance abuse. The patient is nervous/anxious.       Objective  Filed Vitals:   10/17/14 1028  BP: 145/93  Pulse: 64  Temp: 97.5 F (36.4 C)  TempSrc: Oral  Height: 5\' 8"  (1.727 m)  Weight: 193 lb (87.544 kg)     Physical Exam  Constitutional: He is oriented to person, place, and time and well-developed, well-nourished, and in no distress. No distress.  HENT:  Head: Normocephalic and atraumatic.  Eyes: Conjunctivae and EOM are normal. Pupils are equal, round, and reactive to light.  Neck: Normal range of motion. Neck supple. No thyromegaly present.  Cardiovascular: Normal rate, regular rhythm, normal heart sounds and intact distal pulses.  Exam reveals no gallop and no friction rub.   No murmur heard. Pulmonary/Chest: Effort normal and breath sounds normal. No respiratory distress. He has no wheezes. He has no rales.  Abdominal: Soft. Bowel sounds are normal. He exhibits no distension and no mass. There is tenderness (mild, duffuse). There is no rebound.  Musculoskeletal: Normal range of motion. He exhibits no  edema.  Lymphadenopathy:    He has no cervical adenopathy.  Neurological: He is alert and oriented to person, place, and time. No cranial nerve deficit.  Skin: Skin is warm and dry.  Vitals reviewed.     No results found for this or any previous visit (from the past 2160 hour(s)).   Assessment & Plan  1. Hyperlipidemia  - Lipid Profile - Comprehensive Metabolic Panel (CMET)  2. Bipolar affective disorder, currently manic, severe, with psychotic features  - CBC with Differential  3. Chronic low back pain   4. Slow transit constipation

## 2014-10-17 NOTE — Patient Instructions (Signed)
Continue all current meds.  Cont. Seeing Mental Health Provider.

## 2014-11-18 DIAGNOSIS — E785 Hyperlipidemia, unspecified: Secondary | ICD-10-CM | POA: Diagnosis not present

## 2014-11-18 DIAGNOSIS — F312 Bipolar disorder, current episode manic severe with psychotic features: Secondary | ICD-10-CM | POA: Diagnosis not present

## 2014-11-19 LAB — COMPREHENSIVE METABOLIC PANEL
ALBUMIN: 3.9 g/dL (ref 3.5–5.5)
ALT: 10 IU/L (ref 0–44)
AST: 13 IU/L (ref 0–40)
Albumin/Globulin Ratio: 1.6 (ref 1.1–2.5)
Alkaline Phosphatase: 79 IU/L (ref 39–117)
BUN/Creatinine Ratio: 6 — ABNORMAL LOW (ref 9–20)
BUN: 5 mg/dL — ABNORMAL LOW (ref 6–24)
Bilirubin Total: 0.2 mg/dL (ref 0.0–1.2)
CO2: 28 mmol/L (ref 18–29)
CREATININE: 0.85 mg/dL (ref 0.76–1.27)
Calcium: 8.9 mg/dL (ref 8.7–10.2)
Chloride: 104 mmol/L (ref 97–108)
GFR calc Af Amer: 117 mL/min/{1.73_m2} (ref >59)
GFR, EST NON AFRICAN AMERICAN: 101 mL/min/{1.73_m2} (ref >59)
Globulin, Total: 2.4 g/dL (ref 1.5–4.5)
Glucose: 91 mg/dL (ref 65–99)
Potassium: 3.9 mmol/L (ref 3.5–5.2)
Sodium: 142 mmol/L (ref 134–144)
Total Protein: 6.3 g/dL (ref 6.0–8.5)

## 2014-11-19 LAB — CBC WITH DIFFERENTIAL/PLATELET
BASOS: 1 %
Basophils Absolute: 0.1 10*3/uL (ref 0.0–0.2)
EOS (ABSOLUTE): 0.2 10*3/uL (ref 0.0–0.4)
Eos: 3 %
Hematocrit: 40.2 % (ref 37.5–51.0)
Hemoglobin: 14.3 g/dL (ref 12.6–17.7)
Immature Grans (Abs): 0 10*3/uL (ref 0.0–0.1)
Immature Granulocytes: 0 %
Lymphocytes Absolute: 3.3 10*3/uL — ABNORMAL HIGH (ref 0.7–3.1)
Lymphs: 43 %
MCH: 32.2 pg (ref 26.6–33.0)
MCHC: 35.6 g/dL (ref 31.5–35.7)
MCV: 91 fL (ref 79–97)
MONOS ABS: 0.6 10*3/uL (ref 0.1–0.9)
Monocytes: 8 %
NEUTROS ABS: 3.6 10*3/uL (ref 1.4–7.0)
Neutrophils: 45 %
Platelets: 285 10*3/uL (ref 150–379)
RBC: 4.44 x10E6/uL (ref 4.14–5.80)
RDW: 13.6 % (ref 12.3–15.4)
WBC: 7.7 10*3/uL (ref 3.4–10.8)

## 2014-11-19 LAB — LIPID PANEL
CHOLESTEROL TOTAL: 259 mg/dL — AB (ref 100–199)
Chol/HDL Ratio: 10 ratio units — ABNORMAL HIGH (ref 0.0–5.0)
HDL: 26 mg/dL — ABNORMAL LOW (ref >39)
TRIGLYCERIDES: 448 mg/dL — AB (ref 0–149)

## 2014-11-21 ENCOUNTER — Other Ambulatory Visit: Payer: Self-pay

## 2014-11-21 DIAGNOSIS — E785 Hyperlipidemia, unspecified: Secondary | ICD-10-CM

## 2014-11-21 MED ORDER — ATORVASTATIN CALCIUM 20 MG PO TABS: 20.0000 mg | ORAL_TABLET | Freq: Every day | ORAL | Status: DC

## 2014-11-21 NOTE — Addendum Note (Signed)
Addended by: Theresia Majors A on: 11/21/2014 01:45 PM   Modules accepted: Miquel Dunn

## 2014-11-21 NOTE — Progress Notes (Signed)
Advised and called in meds. [facility]

## 2014-12-05 ENCOUNTER — Telehealth: Payer: Self-pay | Admitting: *Deleted

## 2014-12-05 NOTE — Telephone Encounter (Signed)
Patient called and wanted to know the name of cholesterol medication. Dr. Luan Pulling prescribed Atorvastatin 20 mg. Left message on patient vmail.[email or web address]

## 2014-12-05 NOTE — Telephone Encounter (Signed)
Pt has already picked up rX for Lipitor 20 mg. Called and left info on vmail.[place]

## 2014-12-27 DIAGNOSIS — F310 Bipolar disorder, current episode hypomanic: Secondary | ICD-10-CM | POA: Diagnosis not present

## 2015-01-26 ENCOUNTER — Ambulatory Visit: Payer: Self-pay | Admitting: Family Medicine

## 2015-02-09 ENCOUNTER — Ambulatory Visit
Admission: RE | Admit: 2015-02-09 | Discharge: 2015-02-09 | Disposition: A | Discharge status: Home / Self Care | Payer: Disability Insurance | Source: Ambulatory Visit | Attending: *Deleted | Admitting: *Deleted

## 2015-02-09 ENCOUNTER — Ambulatory Visit
Admission: RE | Admit: 2015-02-09 | Discharge: 2015-02-09 | Disposition: A | Discharge status: Home / Self Care | Payer: Disability Insurance | Source: Ambulatory Visit | Attending: Family Medicine | Admitting: Family Medicine

## 2015-02-09 ENCOUNTER — Other Ambulatory Visit: Payer: Self-pay | Admitting: *Deleted

## 2015-02-09 DIAGNOSIS — M549 Dorsalgia, unspecified: Secondary | ICD-10-CM | POA: Diagnosis present

## 2015-02-09 DIAGNOSIS — M47816 Spondylosis without myelopathy or radiculopathy, lumbar region: Secondary | ICD-10-CM | POA: Insufficient documentation

## 2015-03-30 ENCOUNTER — Encounter: Payer: Self-pay | Admitting: Family Medicine

## 2015-03-30 ENCOUNTER — Telehealth: Payer: Self-pay | Admitting: Family Medicine

## 2015-03-30 ENCOUNTER — Ambulatory Visit (INDEPENDENT_AMBULATORY_CARE_PROVIDER_SITE_OTHER): Payer: Commercial Managed Care - HMO | Admitting: Family Medicine

## 2015-03-30 VITALS — BP 124/82 | HR 73 | Temp 98.2°F | Resp 16 | Ht 68.0 in | Wt 197.6 lb

## 2015-03-30 DIAGNOSIS — M5430 Sciatica, unspecified side: Secondary | ICD-10-CM | POA: Insufficient documentation

## 2015-03-30 DIAGNOSIS — M5431 Sciatica, right side: Secondary | ICD-10-CM | POA: Diagnosis not present

## 2015-03-30 MED ORDER — ACETAMINOPHEN 500 MG PO TABS: ORAL_TABLET | ORAL | Status: DC

## 2015-03-30 NOTE — Addendum Note (Signed)
Addended by: Devona Konig on: 03/30/2015 04:30 PM   Modules accepted: Medications

## 2015-03-30 NOTE — Progress Notes (Signed)
Name: Craig Nolan   MRN: [id number]    DOB: February 11, 1966   Date:03/30/2015       Progress Note  Subjective  Chief Complaint  Chief Complaint  Patient presents with  . Referral  . Back Pain    chronic    HPI  Here requesting to be referred to chronic pain management ([facility] clinic in [place])_.   Pain   Had back surgery in 1983.  HE was back to work.  He has had intermitant back and hip pain since.  Now has had 2 weeks of R hip and post. thigh pain again.  No trauma.  When pain starts in hip and thigh it it an ache 'like a bruise".  Pain can last from days to weeks.  Then it will resolve.   No problem-specific assessment & plan notes found for this encounter.   Past Medical History  Diagnosis Date  . Hemorrhoids   . Hypertension   . PTSD (post-traumatic stress disorder)   . Seasonal allergies   . Dyslipidemia   . Hyperlipidemia   . Tobacco use     Past Surgical History  Procedure Laterality Date  . Colonoscopy    . Back surgery    . Cholecystectomy    . Knee surgery      History reviewed. No pertinent family history.  Social History   Social History  . Marital Status: Single    Spouse Name: N/A  . Number of Children: N/A  . Years of Education: N/A   Occupational History  . Not on file.   Social History Main Topics  . Smoking status: Current Every Day Smoker -- 20 years    Types: Cigarettes  . Smokeless tobacco: Never Used  . Alcohol Use: No  . Drug Use: Yes    Special: Marijuana, Cocaine  . Sexual Activity: Not on file   Other Topics Concern  . Not on file   Social History Narrative     Current outpatient prescriptions:  .  atorvastatin (LIPITOR) 20 MG tablet, Take 1 tablet (20 mg total) by mouth at bedtime., Disp: 30 tablet, Rfl: 6 .  buPROPion (WELLBUTRIN XL) 300 MG 24 hr tablet, Take 300 mg by mouth daily., Disp: , Rfl:  .  cloNIDine (CATAPRES) 0.2 MG tablet, Take 0.2 mg by mouth 2 (two) times daily as needed., Disp: , Rfl:  .  diazepam  (VALIUM) 5 MG tablet, Take 5 mg by mouth 2 (two) times daily., Disp: , Rfl:  .  EPITOL 200 MG tablet, Take 200 mg by mouth daily., Disp: , Rfl:  .  gabapentin (NEURONTIN) 300 MG capsule, Take 1,200 mg by mouth daily., Disp: , Rfl:  .  Multiple Vitamin (MULTIVITAMIN) tablet, Take 1 tablet by mouth daily., Disp: , Rfl:  .  ranitidine (ZANTAC) 150 MG tablet, Take 150 mg by mouth at bedtime., Disp: , Rfl:  .  sildenafil (VIAGRA) 50 MG tablet, Take 50 mg by mouth daily as needed for erectile dysfunction., Disp: , Rfl:  .  VRAYLAR 6 MG CAPS, Take 6 mg by mouth daily., Disp: , Rfl:   No Known Allergies   Review of Systems  Constitutional: Negative for fever, chills, weight loss and malaise/fatigue.  HENT: Negative for hearing loss.   Eyes: Negative for blurred vision and double vision.  Respiratory: Positive for wheezing. Negative for cough and shortness of breath.   Cardiovascular: Negative for chest pain, palpitations and leg swelling.  Gastrointestinal: Negative for heartburn, abdominal pain and blood  in stool.  Genitourinary: Negative for dysuria, urgency and frequency.  Musculoskeletal: Positive for back pain. Negative for myalgias and joint pain.  Skin: Negative for rash.  Neurological: Negative for dizziness, tremors, weakness and headaches.      Objective  Filed Vitals:   03/30/15 1333  BP: 124/82  Pulse: 73  Temp: 98.2 F (36.8 C)  TempSrc: Oral  Resp: 16  Height: 5\' 8"  (1.727 m)  Weight: 197 lb 9.6 oz (89.631 kg)    Physical Exam  Constitutional: He is oriented to person, place, and time and well-developed, well-nourished, and in no distress. No distress.  HENT:  Head: Normocephalic and atraumatic.  Neck: Normal range of motion. Neck supple. Carotid bruit is not present. No thyromegaly present.  Cardiovascular: Normal rate, regular rhythm and normal heart sounds.  Exam reveals no gallop and no friction rub.   No murmur heard. Pulmonary/Chest: Effort normal and breath  sounds normal. No respiratory distress. He has no wheezes. He has no rales.  Abdominal: Soft. Bowel sounds are normal. He exhibits no distension and no mass. There is no tenderness.  Musculoskeletal: He exhibits no edema.  Lymphadenopathy:    He has no cervical adenopathy.  Neurological: He is alert and oriented to person, place, and time. No cranial nerve deficit.  Pain to palp. In R lower buttock area and R post.. thigh (high).  +/- pos. Straight leg raising test bilateral, R>L.Marland Kitchen  Strength and sensation is intact in bilateral legs.  Vitals reviewed.      No results found for this or any previous visit (from the past 2160 hour(s)).   Assessment & Plan  Problem List Items Addressed This Visit      Nervous and Auditory   Sciatica - Primary   Relevant Orders   Ambulatory referral to Pain Clinic      No orders of the defined types were placed in this encounter.   1. Sciatica of right side  - Ambulatory referral to Pain Clinic-[facility] pain [facility] in [place] - acetaminophen (TYLENOL) 500 MG tablet; Take 2 tabs up to three times a day.  Dispense: 100 tablet; Refill: 6

## 2015-03-30 NOTE — Telephone Encounter (Signed)
Please call pt for his medication list [phone]

## 2015-03-30 NOTE — Telephone Encounter (Signed)
R/t call to patient to confirm medications. Patient is still not taking Atorvastatin 20 mg. Advised patient to call pharmacy and have filled.

## 2015-03-30 NOTE — Telephone Encounter (Signed)
[id number] 

## 2015-04-27 ENCOUNTER — Telehealth: Payer: Self-pay | Admitting: Family Medicine

## 2015-04-27 NOTE — Telephone Encounter (Signed)
Advised to call pain clinic

## 2015-04-27 NOTE — Telephone Encounter (Signed)
Pt called states that he was suppose to have a referral to the pain clinic  Pt call back # is   [phone]

## 2015-05-24 ENCOUNTER — Encounter: Payer: Self-pay | Admitting: *Deleted

## 2015-05-24 ENCOUNTER — Emergency Department
Admission: EM | Admit: 2015-05-24 | Discharge: 2015-05-24 | Disposition: A | Discharge status: Home / Self Care | Payer: Commercial Managed Care - HMO | Attending: Emergency Medicine | Admitting: Emergency Medicine

## 2015-05-24 DIAGNOSIS — I10 Essential (primary) hypertension: Secondary | ICD-10-CM | POA: Diagnosis not present

## 2015-05-24 DIAGNOSIS — M5442 Lumbago with sciatica, left side: Secondary | ICD-10-CM | POA: Insufficient documentation

## 2015-05-24 DIAGNOSIS — E785 Hyperlipidemia, unspecified: Secondary | ICD-10-CM | POA: Diagnosis not present

## 2015-05-24 DIAGNOSIS — M545 Low back pain: Secondary | ICD-10-CM | POA: Diagnosis not present

## 2015-05-24 DIAGNOSIS — M549 Dorsalgia, unspecified: Secondary | ICD-10-CM | POA: Diagnosis present

## 2015-05-24 DIAGNOSIS — G8929 Other chronic pain: Secondary | ICD-10-CM | POA: Insufficient documentation

## 2015-05-24 DIAGNOSIS — Z79899 Other long term (current) drug therapy: Secondary | ICD-10-CM | POA: Insufficient documentation

## 2015-05-24 DIAGNOSIS — F17210 Nicotine dependence, cigarettes, uncomplicated: Secondary | ICD-10-CM | POA: Insufficient documentation

## 2015-05-24 MED ORDER — DIAZEPAM 5 MG PO TABS
5.0000 mg | ORAL_TABLET | Freq: Once | ORAL | Status: AC
  Administered 2015-05-24: 5 mg via ORAL
  Filled 2015-05-24: qty 1

## 2015-05-24 MED ORDER — KETOROLAC TROMETHAMINE 30 MG/ML IJ SOLN
30.0000 mg | Freq: Once | INTRAMUSCULAR | Status: AC
  Administered 2015-05-24: 30 mg via INTRAMUSCULAR
  Filled 2015-05-24: qty 1

## 2015-05-24 MED ORDER — DIAZEPAM 5 MG PO TABS: 5.0000 mg | ORAL_TABLET | Freq: Three times a day (TID) | ORAL | Status: AC | PRN

## 2015-05-24 NOTE — ED Notes (Signed)
Pt in hallway, anxious to go home, states he hates hospitals. Told him he needed to stay at least 20 minutes after injection.

## 2015-05-24 NOTE — Discharge Instructions (Signed)
Chronic Pain  Chronic pain can be defined as pain that is off and on and lasts for 3-6 months or longer. Many things cause chronic pain, which can make it difficult to make a diagnosis. There are many treatment options available for chronic pain. However, finding a treatment that works well for you may require trying various approaches until the right one is found. Many people benefit from a combination of two or more types of treatment to control their pain.  SYMPTOMS   Chronic pain can occur anywhere in the body and can range from mild to very severe. Some types of chronic pain include:  · Headache.  · Low back pain.  · Cancer pain.  · Arthritis pain.  · Neurogenic pain. This is pain resulting from damage to nerves.   People with chronic pain may also have other symptoms such as:  · Depression.  · Anger.  · Insomnia.  · Anxiety.  DIAGNOSIS   Your health care provider will help diagnose your condition over time. In many cases, the initial focus will be on excluding possible conditions that could be causing the pain. Depending on your symptoms, your health care provider may order tests to diagnose your condition. Some of these tests may include:   · Blood tests.    · CT scan.    · MRI.    · X-rays.    · Ultrasounds.    · Nerve conduction studies.    You may need to see a specialist.   TREATMENT   Finding treatment that works well may take time. You may be referred to a pain specialist. He or she may prescribe medicine or therapies, such as:   · Mindful meditation or yoga.  · Shots (injections) of numbing or pain-relieving medicines into the spine or area of pain.  · Local electrical stimulation.  · Acupuncture.    · Massage therapy.    · Aroma, color, light, or sound therapy.    · Biofeedback.    · Working with a physical therapist to keep from getting stiff.    · Regular, gentle exercise.    · Cognitive or behavioral therapy.    · Group support.    Sometimes, surgery may be recommended.   HOME CARE INSTRUCTIONS    · Take all medicines as directed by your health care provider.    · Lessen stress in your life by relaxing and doing things such as listening to calming music.    · Exercise or be active as directed by your health care provider.    · Eat a healthy diet and include things such as vegetables, fruits, fish, and lean meats in your diet.    · Keep all follow-up appointments with your health care provider.    · Attend a support group with others suffering from chronic pain.  SEEK MEDICAL CARE IF:   · Your pain gets worse.    · You develop a new pain that was not there before.    · You cannot tolerate medicines given to you by your health care provider.    · You have new symptoms since your last visit with your health care provider.    SEEK IMMEDIATE MEDICAL CARE IF:   · You feel weak.    · You have decreased sensation or numbness.    · You lose control of bowel or bladder function.    · Your pain suddenly gets much worse.    · You develop shaking.  · You develop chills.  · You develop confusion.  · You develop chest pain.  · You develop shortness of breath.    MAKE SURE YOU:  ·   Document Revised: 10/28/2012 Document Reviewed: 08/21/2012 Elsevier Interactive Patient Education 2016 Elsevier Inc.  Sciatica Sciatica is pain, weakness, numbness, or tingling along the path of the sciatic nerve. The nerve starts in the lower back and runs down the back of each leg. The nerve controls the muscles in the lower leg and in the back of the knee, while also providing sensation to the back of the thigh, lower leg, and the sole of your foot. Sciatica is a symptom of another medical condition. For  instance, nerve damage or certain conditions, such as a herniated disk or bone spur on the spine, pinch or put pressure on the sciatic nerve. This causes the pain, weakness, or other sensations normally associated with sciatica. Generally, sciatica only affects one side of the body. CAUSES   Herniated or slipped disc.  Degenerative disk disease.  A pain disorder involving the narrow muscle in the buttocks (piriformis syndrome).  Pelvic injury or fracture.  Pregnancy.  Tumor (rare). SYMPTOMS  Symptoms can vary from mild to very severe. The symptoms usually travel from the low back to the buttocks and down the back of the leg. Symptoms can include:  Mild tingling or dull aches in the lower back, leg, or hip.  Numbness in the back of the calf or sole of the foot.  Burning sensations in the lower back, leg, or hip.  Sharp pains in the lower back, leg, or hip.  Leg weakness.  Severe back pain inhibiting movement. These symptoms may get worse with coughing, sneezing, laughing, or prolonged sitting or standing. Also, being overweight may worsen symptoms. DIAGNOSIS  Your caregiver will perform a physical exam to look for common symptoms of sciatica. He or she may ask you to do certain movements or activities that would trigger sciatic nerve pain. Other tests may be performed to find the cause of the sciatica. These may include:  Blood tests.  X-rays.  Imaging tests, such as an MRI or CT scan. TREATMENT  Treatment is directed at the cause of the sciatic pain. Sometimes, treatment is not necessary and the pain and discomfort goes away on its own. If treatment is needed, your caregiver may suggest:  Over-the-counter medicines to relieve pain.  Prescription medicines, such as anti-inflammatory medicine, muscle relaxants, or narcotics.  Applying heat or ice to the painful area.  Steroid injections to lessen pain, irritation, and inflammation around the nerve.  Reducing activity  during periods of pain.  Exercising and stretching to strengthen your abdomen and improve flexibility of your spine. Your caregiver may suggest losing weight if the extra weight makes the back pain worse.  Physical therapy.  Surgery to eliminate what is pressing or pinching the nerve, such as a bone spur or part of a herniated disk. HOME CARE INSTRUCTIONS   Only take over-the-counter or prescription medicines for pain or discomfort as directed by your caregiver.  Apply ice to the affected area for 20 minutes, 3-4 times a day for the first 48-72 hours. Then try heat in the same way.  Exercise, stretch, or perform your usual activities if these do not aggravate your pain.  Attend physical therapy sessions as directed by your caregiver.  Keep all follow-up appointments as directed by your caregiver.  Do not wear high heels or shoes that do not provide proper support.  Check your mattress to see if it is too soft. A firm mattress may lessen your pain and discomfort. SEEK IMMEDIATE MEDICAL CARE IF:   You lose control of your  bowel or bladder (incontinence).  You have increasing weakness in the lower back, pelvis, buttocks, or legs.  You have redness or swelling of your back.  You have a burning sensation when you urinate.  You have pain that gets worse when you lie down or awakens you at night.  Your pain is worse than you have experienced in the past.  Your pain is lasting longer than 4 weeks.  You are suddenly losing weight without reason. MAKE SURE YOU:  Understand these instructions.  Will watch your condition.  Will get help right away if you are not doing well or get worse.   This information is not intended to replace advice given to you by your health care provider. Make sure you discuss any questions you have with your health care provider.   Document Released: 02/19/2001 Document Revised: 11/16/2014 Document Reviewed: 07/07/2011 Elsevier Interactive Patient  Education [employer].

## 2015-05-24 NOTE — ED Notes (Signed)
Pt has chronic back pain, pt reports an increase in pain

## 2015-05-24 NOTE — ED Notes (Signed)
Pt presents with lower back pain for years, previous back surgery back in 1983, has an appt with pain management tomorrow. States pain is radiating down into left leg, sharp shooting pain.

## 2015-05-24 NOTE — ED Provider Notes (Signed)
[facility] Emergency Department Provider Note  ____________________________________________  Time seen: Approximately 10:58 AM  I have reviewed the triage vital signs and the nursing notes.   HISTORY  Chief Complaint Back Pain    HPI OTTAVIO BITZ is a 50 y.o. male , NAD, presents to the emergency department with one-day history of acute lower back pain radiating down the left leg. Patient notes he has had chronic lower back pain since the 1980s when he had to have surgery to repair bulging disks. States he normally has severe pain that radiates down the right leg but today is different with radiation into the left leg. Does not have a primary care provider who manages his pain. Does note his psychiatrist gives him tramadol and diazepam for his pain, but he notes these medications do not help him.  He does have an appointment with pain management tomorrow morning to establish care. Denies any recent injuries, traumas, falls. States he does not work as he is on disability due to his chronic back pain and psychological disorders. Denies numbness, weakness, tingling. Has not lost control of bowel or bladder function. Denies any saddle paresthesias. Has had a fever, chills, body aches. No abdominal pain, nausea, vomiting.   Past Medical History  Diagnosis Date  . Hemorrhoids   . Hypertension   . PTSD (post-traumatic stress disorder)   . Seasonal allergies   . Dyslipidemia   . Hyperlipidemia   . Tobacco use     Patient Active Problem List   Diagnosis Date Noted  . Sciatica 03/30/2015  . Pain in the chest 12/17/2013  . Hyperlipidemia   . Tobacco use     Past Surgical History  Procedure Laterality Date  . Colonoscopy    . Back surgery    . Cholecystectomy    . Knee surgery      Current Outpatient Rx  Name  Route  Sig  Dispense  Refill  . acetaminophen (TYLENOL) 500 MG tablet      Take 2 tabs up to three times a day.   100 tablet   6   .  atorvastatin (LIPITOR) 20 MG tablet   Oral   Take 1 tablet (20 mg total) by mouth at bedtime.   30 tablet   6   . buPROPion (WELLBUTRIN XL) 300 MG 24 hr tablet   Oral   Take 300 mg by mouth daily.         . cloNIDine (CATAPRES) 0.2 MG tablet   Oral   Take 0.2 mg by mouth 2 (two) times daily as needed.         . diazepam (VALIUM) 5 MG tablet   Oral   Take 1 tablet (5 mg total) by mouth every 8 (eight) hours as needed for anxiety.   3 tablet   0   . docusate sodium (STOOL SOFTENER) 100 MG capsule   Oral   Take 100 mg by mouth 2 (two) times daily.         . EPITOL 200 MG tablet   Oral   Take 200 mg by mouth daily.           Dispense as written.   . gabapentin (NEURONTIN) 300 MG capsule   Oral   Take 1,200 mg by mouth daily.         . Multiple Vitamin (MULTIVITAMIN) tablet   Oral   Take 1 tablet by mouth daily.         . Multiple  Vitamins-Minerals (CENTRUM SILVER ADULT 50+ PO)   Oral   Take 1 tablet by mouth daily.         . QUEtiapine (SEROQUEL) 50 MG tablet   Oral   Take 50 mg by mouth 2 (two) times daily.         . ranitidine (ZANTAC) 150 MG tablet   Oral   Take 150 mg by mouth at bedtime.         . sildenafil (VIAGRA) 50 MG tablet   Oral   Take 50 mg by mouth daily as needed for erectile dysfunction.           Allergies Review of patient's allergies indicates no known allergies.  No family history on file.  Social History Social History  Substance Use Topics  . Smoking status: Current Every Day Smoker -- 20 years    Types: Cigarettes  . Smokeless tobacco: Never Used  . Alcohol Use: No     Review of Systems  Constitutional: No fever/chills Eyes: No visual changes.  Cardiovascular: No chest pain. Respiratory:  No shortness of breath. No wheezing.  Gastrointestinal: No abdominal pain.  No nausea, vomiting.   Musculoskeletal: Positive for back pain radiating to left leg.  Skin: Negative for rash. Neurological: Negative for  headaches, focal weakness or numbness. No saddle paresthesias. 10-point ROS otherwise negative.  ____________________________________________   PHYSICAL EXAM:  VITAL SIGNS: ED Triage Vitals  Enc Vitals Group     BP 05/24/15 1001 138/95 mmHg     Pulse Rate 05/24/15 1001 103     Resp 05/24/15 1001 20     Temp 05/24/15 1001 97.6 F (36.4 C)     Temp Source 05/24/15 1001 Oral     SpO2 05/24/15 1001 100 %     Weight 05/24/15 1001 197 lb (89.359 kg)     Height 05/24/15 1001 5\' 9"  (1.753 m)     Head Cir --      Peak Flow --      Pain Score 05/24/15 0959 8     Pain Loc --      Pain Edu? --      Excl. in [place]? --     Constitutional: Alert and oriented. Well appearing and in no acute distress when this provider approaches the exam room but when the patient speaks to this provider he cringes and jumps in his hospital provided wheelchair. Eyes: Conjunctivae are normal.   Head: Atraumatic. Neck: Supple with full range of motion Cardiovascular:  Good peripheral circulation. Respiratory: Normal respiratory effort without tachypnea or retractions.  Musculoskeletal: Patient grimaces and jumps with palpation about the left thoracic and lumbar paraspinal regions and musculoskeletal lateral areas. All muscle spasm is appreciated in this area. Patient declines full range of motion examination but he is monitored standing from a sitting position with minimal pain.  No joint effusions. Neurologic:  Normal speech and language. No gross focal neurologic deficits are appreciated.  Skin:  Skin is warm, dry and intact. No rash, redness, swelling noted. Psychiatric: Mood and affect are normal. Speech and behavior are normal. Patient exhibits appropriate insight and judgement.   ____________________________________________    LABS  None  ____________________________________________  EKG  None ____________________________________________  RADIOLOGY  None  ____________________________________________    PROCEDURES  Procedure(s) performed: None    Medications  ketorolac (TORADOL) 30 MG/ML injection 30 mg (30 mg Intramuscular Given 05/24/15 1116)  diazepam (VALIUM) tablet 5 mg (5 mg Oral Given 05/24/15 1117)   ----------------------------------------- 11:55 AM  on 05/24/2015 -----------------------------------------  As I was approaching the patient's exam room to reevaluate after administration of pain medications, he was seen standing in front of the neighboring exam room speaking with the patient in that room. He was commenting to that neighboring patient, that he (the neighboring patient) should not retire, but should file for disability as he would get more money. He then boasted that he receives monthly checks in the amount of $5800 every month for his disability. He also stated that gaining his disability was easy as he did not have to specify why he could no longer work due to his pain. Patient was standing without pain, had normal gait/posture, was laughing and joking.  ____________________________________________   INITIAL IMPRESSION / ASSESSMENT AND PLAN / ED COURSE  Pertinent labs & imaging results that were available during my care of the patient were reviewed by me and considered in my medical decision making (see chart for details).  Patient's diagnosis is consistent with acute on chronic lower back pain with left-sided sciatica. Patient will be discharged home with prescriptions for #3 tablets of diazepam to bridge the gap for the patient to be seen by pain management tomorrow morning. Did advise the patient that he should not take more than the amount of medications as he is prescribed by his medical providers. [place] drug database was consulted noting the patient received  his prescription of diazepam from his psychiatrist on March 3. Advise the patient that that prescription should've lasted him until April 3. Patient is to follow up with pain management tomorrow morning as currently scheduled. Patient is given ED precautions to return to the ED for any worsening or new symptoms.    ____________________________________________  FINAL CLINICAL IMPRESSION(S) / ED DIAGNOSES  Final diagnoses:  Acute bilateral low back pain with left-sided sciatica  Chronic back pain      NEW MEDICATIONS STARTED DURING THIS VISIT:  Discharge Medication List as of 05/24/2015 11:52 AM           Braxton Feathers, PA-C 05/24/15 1256  Daymon Larsen, MD 05/24/15 1315

## 2015-05-25 ENCOUNTER — Other Ambulatory Visit
Admission: RE | Admit: 2015-05-25 | Discharge: 2015-05-25 | Disposition: A | Discharge status: Home / Self Care | Payer: Commercial Managed Care - HMO | Source: Ambulatory Visit | Attending: Pain Medicine | Admitting: Pain Medicine

## 2015-05-25 ENCOUNTER — Ambulatory Visit: Payer: Commercial Managed Care - HMO | Attending: Pain Medicine | Admitting: Pain Medicine

## 2015-05-25 ENCOUNTER — Encounter: Payer: Self-pay | Admitting: Pain Medicine

## 2015-05-25 VITALS — BP 135/82 | HR 87 | Temp 98.4°F | Resp 20 | Ht 69.0 in | Wt 197.0 lb

## 2015-05-25 DIAGNOSIS — F1290 Cannabis use, unspecified, uncomplicated: Secondary | ICD-10-CM

## 2015-05-25 DIAGNOSIS — G4730 Sleep apnea, unspecified: Secondary | ICD-10-CM | POA: Diagnosis not present

## 2015-05-25 DIAGNOSIS — M79605 Pain in left leg: Secondary | ICD-10-CM | POA: Diagnosis not present

## 2015-05-25 DIAGNOSIS — M4690 Unspecified inflammatory spondylopathy, site unspecified: Secondary | ICD-10-CM | POA: Insufficient documentation

## 2015-05-25 DIAGNOSIS — R531 Weakness: Secondary | ICD-10-CM | POA: Insufficient documentation

## 2015-05-25 DIAGNOSIS — M791 Myalgia: Secondary | ICD-10-CM | POA: Insufficient documentation

## 2015-05-25 DIAGNOSIS — M792 Neuralgia and neuritis, unspecified: Secondary | ICD-10-CM

## 2015-05-25 DIAGNOSIS — F3111 Bipolar disorder, current episode manic without psychotic features, mild: Secondary | ICD-10-CM

## 2015-05-25 DIAGNOSIS — M961 Postlaminectomy syndrome, not elsewhere classified: Secondary | ICD-10-CM

## 2015-05-25 DIAGNOSIS — M549 Dorsalgia, unspecified: Secondary | ICD-10-CM | POA: Diagnosis present

## 2015-05-25 DIAGNOSIS — K589 Irritable bowel syndrome without diarrhea: Secondary | ICD-10-CM | POA: Insufficient documentation

## 2015-05-25 DIAGNOSIS — K5909 Other constipation: Secondary | ICD-10-CM

## 2015-05-25 DIAGNOSIS — M25551 Pain in right hip: Secondary | ICD-10-CM | POA: Diagnosis not present

## 2015-05-25 DIAGNOSIS — R32 Unspecified urinary incontinence: Secondary | ICD-10-CM | POA: Diagnosis not present

## 2015-05-25 DIAGNOSIS — F1210 Cannabis abuse, uncomplicated: Secondary | ICD-10-CM

## 2015-05-25 DIAGNOSIS — M62838 Other muscle spasm: Secondary | ICD-10-CM

## 2015-05-25 DIAGNOSIS — F17210 Nicotine dependence, cigarettes, uncomplicated: Secondary | ICD-10-CM | POA: Insufficient documentation

## 2015-05-25 DIAGNOSIS — Z79899 Other long term (current) drug therapy: Secondary | ICD-10-CM | POA: Diagnosis not present

## 2015-05-25 DIAGNOSIS — M545 Low back pain, unspecified: Secondary | ICD-10-CM | POA: Insufficient documentation

## 2015-05-25 DIAGNOSIS — M47816 Spondylosis without myelopathy or radiculopathy, lumbar region: Secondary | ICD-10-CM | POA: Insufficient documentation

## 2015-05-25 DIAGNOSIS — M25552 Pain in left hip: Secondary | ICD-10-CM | POA: Diagnosis not present

## 2015-05-25 DIAGNOSIS — M25559 Pain in unspecified hip: Secondary | ICD-10-CM

## 2015-05-25 DIAGNOSIS — M5430 Sciatica, unspecified side: Secondary | ICD-10-CM | POA: Insufficient documentation

## 2015-05-25 DIAGNOSIS — F4310 Post-traumatic stress disorder, unspecified: Secondary | ICD-10-CM | POA: Diagnosis not present

## 2015-05-25 DIAGNOSIS — F1410 Cocaine abuse, uncomplicated: Secondary | ICD-10-CM

## 2015-05-25 DIAGNOSIS — G8929 Other chronic pain: Secondary | ICD-10-CM

## 2015-05-25 DIAGNOSIS — G4700 Insomnia, unspecified: Secondary | ICD-10-CM

## 2015-05-25 DIAGNOSIS — R159 Full incontinence of feces: Secondary | ICD-10-CM | POA: Insufficient documentation

## 2015-05-25 DIAGNOSIS — K5900 Constipation, unspecified: Secondary | ICD-10-CM

## 2015-05-25 DIAGNOSIS — E785 Hyperlipidemia, unspecified: Secondary | ICD-10-CM | POA: Insufficient documentation

## 2015-05-25 DIAGNOSIS — M539 Dorsopathy, unspecified: Secondary | ICD-10-CM | POA: Diagnosis not present

## 2015-05-25 DIAGNOSIS — F419 Anxiety disorder, unspecified: Secondary | ICD-10-CM | POA: Insufficient documentation

## 2015-05-25 DIAGNOSIS — F319 Bipolar disorder, unspecified: Secondary | ICD-10-CM | POA: Insufficient documentation

## 2015-05-25 DIAGNOSIS — M5416 Radiculopathy, lumbar region: Secondary | ICD-10-CM

## 2015-05-25 DIAGNOSIS — M47896 Other spondylosis, lumbar region: Secondary | ICD-10-CM | POA: Diagnosis not present

## 2015-05-25 DIAGNOSIS — I10 Essential (primary) hypertension: Secondary | ICD-10-CM | POA: Insufficient documentation

## 2015-05-25 DIAGNOSIS — F1490 Cocaine use, unspecified, uncomplicated: Secondary | ICD-10-CM

## 2015-05-25 DIAGNOSIS — M7918 Myalgia, other site: Secondary | ICD-10-CM

## 2015-05-25 DIAGNOSIS — M5442 Lumbago with sciatica, left side: Secondary | ICD-10-CM

## 2015-05-25 DIAGNOSIS — F1990 Other psychoactive substance use, unspecified, uncomplicated: Secondary | ICD-10-CM

## 2015-05-25 DIAGNOSIS — M5440 Lumbago with sciatica, unspecified side: Secondary | ICD-10-CM | POA: Insufficient documentation

## 2015-05-25 DIAGNOSIS — Z5181 Encounter for therapeutic drug level monitoring: Secondary | ICD-10-CM | POA: Insufficient documentation

## 2015-05-25 LAB — COMPREHENSIVE METABOLIC PANEL
ALBUMIN: 4 g/dL (ref 3.5–5.0)
ALK PHOS: 79 U/L (ref 38–126)
ALT: 14 U/L — ABNORMAL LOW (ref 17–63)
ANION GAP: 9 (ref 5–15)
AST: 23 U/L (ref 15–41)
BUN: 7 mg/dL (ref 6–20)
CO2: 31 mmol/L (ref 22–32)
Calcium: 9 mg/dL (ref 8.9–10.3)
Chloride: 97 mmol/L — ABNORMAL LOW (ref 101–111)
Creatinine, Ser: 0.8 mg/dL (ref 0.61–1.24)
GFR calc Af Amer: >60 mL/min (ref >60)
GFR calc non Af Amer: >60 mL/min (ref >60)
GLUCOSE: 88 mg/dL (ref 65–99)
POTASSIUM: 3 mmol/L — AB (ref 3.5–5.1)
SODIUM: 137 mmol/L (ref 135–145)
Total Bilirubin: 0.5 mg/dL (ref 0.3–1.2)
Total Protein: 7.5 g/dL (ref 6.5–8.1)

## 2015-05-25 LAB — LIPID PANEL
CHOL/HDL RATIO: 5.1 ratio
CHOLESTEROL: 249 mg/dL — AB (ref 0–200)
HDL: 49 mg/dL (ref >40)
LDL Cholesterol: 156 mg/dL — ABNORMAL HIGH (ref 0–99)
Triglycerides: 219 mg/dL — ABNORMAL HIGH (ref <150)
VLDL: 44 mg/dL — ABNORMAL HIGH (ref 0–40)

## 2015-05-25 LAB — SEDIMENTATION RATE: SED RATE: 5 mm/h (ref 0–20)

## 2015-05-25 LAB — MAGNESIUM: Magnesium: 1.8 mg/dL (ref 1.7–2.4)

## 2015-05-25 MED ORDER — BENEFIBER PO POWD: ORAL | Status: DC

## 2015-05-25 MED ORDER — ORPHENADRINE CITRATE 30 MG/ML IJ SOLN
INTRAMUSCULAR | Status: AC
  Administered 2015-05-25: 15:00:00 via INTRAMUSCULAR
  Filled 2015-05-25: qty 2

## 2015-05-25 MED ORDER — MELATONIN 10 MG PO CAPS: 20.0000 mg | ORAL_CAPSULE | Freq: Every evening | ORAL | Status: DC | PRN

## 2015-05-25 MED ORDER — MAGNESIUM OXIDE -MG SUPPLEMENT 500 MG PO CAPS: 1.0000 | ORAL_CAPSULE | Freq: Every day | ORAL | Status: DC

## 2015-05-25 MED ORDER — KETOROLAC TROMETHAMINE 60 MG/2ML IM SOLN
INTRAMUSCULAR | Status: AC
  Administered 2015-05-25: 15:00:00 via INTRAMUSCULAR
  Filled 2015-05-25: qty 2

## 2015-05-25 MED ORDER — METHYLPREDNISOLONE 4 MG PO TBPK: ORAL_TABLET | ORAL | Status: DC

## 2015-05-25 MED ORDER — BISACODYL 5 MG PO TBEC: 10.0000 mg | DELAYED_RELEASE_TABLET | Freq: Every evening | ORAL | Status: DC | PRN

## 2015-05-25 MED ORDER — KETOROLAC TROMETHAMINE 60 MG/2ML IM SOLN: 60.0000 mg | Freq: Once | INTRAMUSCULAR | Status: AC

## 2015-05-25 MED ORDER — ORPHENADRINE CITRATE 30 MG/ML IJ SOLN: 60.0000 mg | Freq: Once | INTRAMUSCULAR | Status: DC

## 2015-05-25 MED ORDER — DOCUSATE SODIUM 100 MG PO CAPS: 200.0000 mg | ORAL_CAPSULE | Freq: Every evening | ORAL | Status: DC | PRN

## 2015-05-25 NOTE — Progress Notes (Signed)
Safety precautions to be maintained throughout the outpatient stay will include: orient to surroundings, keep bed in low position, maintain call bell within reach at all times, provide assistance with transfer out of bed and ambulation.  Pt here today for New pt eval

## 2015-05-25 NOTE — Progress Notes (Signed)
Quick Note:   Potassium levels below 3.6 mmol/L are considered to be low. Levels (less than 2.5 mmol/L) can be life-threatening and requires urgent medical attention. Low potassium (hypokalemia) has many causes. The most common cause is excessive potassium loss in urine due to prescription water or fluid pills (diuretics). Vomiting or diarrhea or both can result in excessive potassium loss from the digestive tract. Causes of potassium loss leading to low potassium include: chronic kidney disease; diabetic ketoacidosis; diarrhea; excessive alcohol use; excessive laxative use; excessive sweating; folic acid deficiency; diuretics; primary aldosteronism; vomiting; and/or some antibiotic use.  Normal chloride levels are between 95 and 107 mEq/L. Low levels may be due to: Addison disease; Bartter syndrome; burns; congestive heart failure; dehydration; excessive sweating; hyperaldosteronism; metabolic alkalosis; respiratory acidosis (compensated); Syndrome of inappropriate diuretic hormone secretion (SIADH); or vomiting.  While most low ALT level results indicate a normal healthy liver, that may not always be the case. A low-functioning or non-functioning liver, lacking normal levels of ALT activity to begin with, would not release a lot of ALT into the blood when damaged. People infected with the hepatitis C virus initially show high ALT levels in their blood, but these levels fall over time. Because the ALT test measures ALT levels at only one point in time, people with chronic hepatitis C infection may already have experienced the ALT peak well before blood was drawn for the ALT test. Urinary tract infections or malnutrition may also cause low blood ALT levels. ______
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
## 2015-05-25 NOTE — Patient Instructions (Signed)
GENERAL RISKS AND COMPLICATIONS  What are the risk, side effects and possible complications? Generally speaking, most procedures are safe.  However, with any procedure there are risks, side effects, and the possibility of complications.  The risks and complications are dependent upon the sites that are lesioned, or the type of nerve block to be performed.  The closer the procedure is to the spine, the more serious the risks are.  Great care is taken when placing the radio frequency needles, block needles or lesioning probes, but sometimes complications can occur. 1. Infection: Any time there is an injection through the skin, there is a risk of infection.  This is why sterile conditions are used for these blocks.  There are four possible types of infection. 1. Localized skin infection. 2. Central Nervous System Infection-This can be in the form of Meningitis, which can be deadly. 3. Epidural Infections-This can be in the form of an epidural abscess, which can cause pressure inside of the spine, causing compression of the spinal cord with subsequent paralysis. This would require an emergency surgery to decompress, and there are no guarantees that the patient would recover from the paralysis. 4. Discitis-This is an infection of the intervertebral discs.  It occurs in about 1% of discography procedures.  It is difficult to treat and it may lead to surgery.        2. Pain: the needles have to go through skin and soft tissues, will cause soreness.       3. Damage to internal structures:  The nerves to be lesioned may be near blood vessels or    other nerves which can be potentially damaged.       4. Bleeding: Bleeding is more common if the patient is taking blood thinners such as  aspirin, Coumadin, Ticiid, Plavix, etc., or if he/she have some genetic predisposition  such as hemophilia. Bleeding into the spinal canal can cause compression of the spinal  cord with subsequent paralysis.  This would require an  emergency surgery to  decompress and there are no guarantees that the patient would recover from the  paralysis.       5. Pneumothorax:  Puncturing of a lung is a possibility, every time a needle is introduced in  the area of the chest or upper back.  Pneumothorax refers to free air around the  collapsed lung(s), inside of the thoracic cavity (chest cavity).  Another two possible  complications related to a similar event would include: Hemothorax and Chylothorax.   These are variations of the Pneumothorax, where instead of air around the collapsed  lung(s), you may have blood or chyle, respectively.       6. Spinal headaches: They may occur with any procedures in the area of the spine.       7. Persistent CSF (Cerebro-Spinal Fluid) leakage: This is a rare problem, but may occur  with prolonged intrathecal or epidural catheters either due to the formation of a fistulous  track or a dural tear.       8. Nerve damage: By working so close to the spinal cord, there is always a possibility of  nerve damage, which could be as serious as a permanent spinal cord injury with  paralysis.       9. Death:  Although rare, severe deadly allergic reactions known as "Anaphylactic  reaction" can occur to any of the medications used.      10. Worsening of the symptoms:  We can always make thing worse.    What are the chances of something like this happening? Chances of any of this occuring are extremely low.  By statistics, you have more of a chance of getting killed in a motor vehicle accident: while driving to the hospital than any of the above occurring .  Nevertheless, you should be aware that they are possibilities.  In general, it is similar to taking a shower.  Everybody knows that you can slip, hit your head and get killed.  Does that mean that you should not shower again?  Nevertheless always keep in mind that statistics do not mean anything if you happen to be on the wrong side of them.  Even if a procedure has a 1  (one) in a 1,000,000 (million) chance of going wrong, it you happen to be that one..Also, keep in mind that by statistics, you have more of a chance of having something go wrong when taking medications.  Who should not have this procedure? If you are on a blood thinning medication (e.g. Coumadin, Plavix, see list of "Blood Thinners"), or if you have an active infection going on, you should not have the procedure.  If you are taking any blood thinners, please inform your physician.  How should I prepare for this procedure?  Do not eat or drink anything at least six hours prior to the procedure.  Bring a driver with you .  It cannot be a taxi.  Come accompanied by an adult that can drive you back, and that is strong enough to help you if your legs get weak or numb from the local anesthetic.  Take all of your medicines the morning of the procedure with just enough water to swallow them.  If you have diabetes, make sure that you are scheduled to have your procedure done first thing in the morning, whenever possible.  If you have diabetes, take only half of your insulin dose and notify our nurse that you have done so as soon as you arrive at the clinic.  If you are diabetic, but only take blood sugar pills (oral hypoglycemic), then do not take them on the morning of your procedure.  You may take them after you have had the procedure.  Do not take aspirin or any aspirin-containing medications, at least eleven (11) days prior to the procedure.  They may prolong bleeding.  Wear loose fitting clothing that may be easy to take off and that you would not mind if it got stained with Betadine or blood.  Do not wear any jewelry or perfume  Remove any nail coloring.  It will interfere with some of our monitoring equipment.  NOTE: Remember that this is not meant to be interpreted as a complete list of all possible complications.  Unforeseen problems may occur.  BLOOD THINNERS The following drugs  contain aspirin or other products, which can cause increased bleeding during surgery and should not be taken for 2 weeks prior to and 1 week after surgery.  If you should need take something for relief of minor pain, you may take acetaminophen which is found in Tylenol,m Datril, Anacin-3 and Panadol. It is not blood thinner. The products listed below are.  Do not take any of the products listed below in addition to any listed on your instruction sheet.  A.P.C or A.P.C with Codeine Codeine Phosphate Capsules #3 Ibuprofen Ridaura  ABC compound Congesprin Imuran rimadil  Advil Cope Indocin Robaxisal  Alka-Seltzer Effervescent Pain Reliever and Antacid Coricidin or Coricidin-D  Indomethacin Rufen    Alka-Seltzer plus Cold Medicine Cosprin Ketoprofen S-A-C Tablets  Anacin Analgesic Tablets or Capsules Coumadin Korlgesic Salflex  Anacin Extra Strength Analgesic tablets or capsules CP-2 Tablets Lanoril Salicylate  Anaprox Cuprimine Capsules Levenox Salocol  Anexsia-D Dalteparin Magan Salsalate  Anodynos Darvon compound Magnesium Salicylate Sine-off  Ansaid Dasin Capsules Magsal Sodium Salicylate  Anturane Depen Capsules Marnal Soma  APF Arthritis pain formula Dewitt's Pills Measurin Stanback  Argesic Dia-Gesic Meclofenamic Sulfinpyrazone  Arthritis Bayer Timed Release Aspirin Diclofenac Meclomen Sulindac  Arthritis pain formula Anacin Dicumarol Medipren Supac  Analgesic (Safety coated) Arthralgen Diffunasal Mefanamic Suprofen  Arthritis Strength Bufferin Dihydrocodeine Mepro Compound Suprol  Arthropan liquid Dopirydamole Methcarbomol with Aspirin Synalgos  ASA tablets/Enseals Disalcid Micrainin Tagament  Ascriptin Doan's Midol Talwin  Ascriptin A/D Dolene Mobidin Tanderil  Ascriptin Extra Strength Dolobid Moblgesic Ticlid  Ascriptin with Codeine Doloprin or Doloprin with Codeine Momentum Tolectin  Asperbuf Duoprin Mono-gesic Trendar  Aspergum Duradyne Motrin or Motrin IB Triminicin  Aspirin  plain, buffered or enteric coated Durasal Myochrisine Trigesic  Aspirin Suppositories Easprin Nalfon Trillsate  Aspirin with Codeine Ecotrin Regular or Extra Strength Naprosyn Uracel  Atromid-S Efficin Naproxen Ursinus  Auranofin Capsules Elmiron Neocylate Vanquish  Axotal Emagrin Norgesic Verin  Azathioprine Empirin or Empirin with Codeine Normiflo Vitamin E  Azolid Emprazil Nuprin Voltaren  Bayer Aspirin plain, buffered or children's or timed BC Tablets or powders Encaprin Orgaran Warfarin Sodium  Buff-a-Comp Enoxaparin Orudis Zorpin  Buff-a-Comp with Codeine Equegesic Os-Cal-Gesic   Buffaprin Excedrin plain, buffered or Extra Strength Oxalid   Bufferin Arthritis Strength Feldene Oxphenbutazone   Bufferin plain or Extra Strength Feldene Capsules Oxycodone with Aspirin   Bufferin with Codeine Fenoprofen Fenoprofen Pabalate or Pabalate-SF   Buffets II Flogesic Panagesic   Buffinol plain or Extra Strength Florinal or Florinal with Codeine Panwarfarin   Buf-Tabs Flurbiprofen Penicillamine   Butalbital Compound Four-way cold tablets Penicillin   Butazolidin Fragmin Pepto-Bismol   Carbenicillin Geminisyn Percodan   Carna Arthritis Reliever Geopen Persantine   Carprofen Gold's salt Persistin   Chloramphenicol Goody's Phenylbutazone   Chloromycetin Haltrain Piroxlcam   Clmetidine heparin Plaquenil   Cllnoril Hyco-pap Ponstel   Clofibrate Hydroxy chloroquine Propoxyphen         Before stopping any of these medications, be sure to consult the physician who ordered them.  Some, such as Coumadin (Warfarin) are ordered to prevent or treat serious conditions such as "deep thrombosis", "pumonary embolisms", and other heart problems.  The amount of time that you may need off of the medication may also vary with the medication and the reason for which you were taking it.  If you are taking any of these medications, please make sure you notify your pain physician before you undergo any  procedures.         Epidural Steroid Injection An epidural steroid injection is given to relieve pain in your neck, back, or legs that is caused by the irritation or swelling of a nerve root. This procedure involves injecting a steroid and numbing medicine (anesthetic) into the epidural space. The epidural space is the space between the outer covering of your spinal cord and the bones that form your backbone (vertebra).  LET YOUR HEALTH CARE PROVIDER KNOW ABOUT:  2. Any allergies you have. 3. All medicines you are taking, including vitamins, herbs, eye drops, creams, and over-the-counter medicines such as aspirin. 4. Previous problems you or members of your family have had with the use of anesthetics. 5. Any blood disorders or blood clotting disorders you have.   6. Previous surgeries you have had. 7. Medical conditions you have. RISKS AND COMPLICATIONS Generally, this is a safe procedure. However, as with any procedure, complications can occur. Possible complications of epidural steroid injection include:  Headache.  Bleeding.  Infection.  Allergic reaction to the medicines.  Damage to your nerves. The response to this procedure depends on the underlying cause of the pain and its duration. People who have long-term (chronic) pain are less likely to benefit from epidural steroids than are those people whose pain comes on strong and suddenly. BEFORE THE PROCEDURE   Ask your health care provider about changing or stopping your regular medicines. You may be advised to stop taking blood-thinning medicines a few days before the procedure.  You may be given medicines to reduce anxiety.  Arrange for someone to take you home after the procedure. PROCEDURE   You will remain awake during the procedure. You may receive medicine to make you relaxed.  You will be asked to lie on your stomach.  The injection site will be cleaned.  The injection site will be numbed with a medicine (local  anesthetic).  A needle will be injected through your skin into the epidural space.  Your health care provider will use an X-ray machine to ensure that the steroid is delivered closest to the affected nerve. You may have minimal discomfort at this time.  Once the needle is in the right position, the local anesthetic and the steroid will be injected into the epidural space.  The needle will then be removed and a bandage will be applied to the injection site. AFTER THE PROCEDURE  12. You may be monitored for a short time before you go home. 13. You may feel weakness or numbness in your arm or leg, which disappears within hours. 14. You may be allowed to eat, drink, and take your regular medicine. 15. You may have soreness at the site of the injection.   This information is not intended to replace advice given to you by your health care provider. Make sure you discuss any questions you have with your health care provider.   Document Released: 06/04/2007 Document Revised: 10/28/2012 Document Reviewed: 08/14/2012 Elsevier Interactive Patient Education 2016 [employer]  

## 2015-05-26 LAB — C-REACTIVE PROTEIN: CRP: <0.5 mg/dL (ref <1.0)

## 2015-05-26 LAB — VITAMIN B12: Vitamin B-12: 386 pg/mL (ref 180–914)

## 2015-05-29 ENCOUNTER — Telehealth: Payer: Self-pay

## 2015-05-29 LAB — VITAMIN D 1,25 DIHYDROXY
VITAMIN D 1, 25 (OH) TOTAL: 102 pg/mL
VITAMIN D3 1, 25 (OH): 102 pg/mL

## 2015-05-29 NOTE — Telephone Encounter (Signed)
Patient advised that he had a procedure ordered and he chose to do this.  Gave  Pre procedure instructions  And patient states understanding.  Phone call transferred to secretary to make appointment.

## 2015-05-29 NOTE — Telephone Encounter (Signed)
Pt wants pain meds. The pain meds pt has does not work

## 2015-06-01 LAB — COMPLIANCE DRUG ANALYSIS, UR: PDF: 0

## 2015-06-05 ENCOUNTER — Telehealth: Payer: Self-pay | Admitting: Pain Medicine

## 2015-06-05 NOTE — Telephone Encounter (Signed)
Wants to know what this procedure is supposed to do for him because if it wont get rid of his pain no need to have it, wants to speak with nurse about this

## 2015-06-05 NOTE — Telephone Encounter (Signed)
Epidural steroid injection procedure explained to patient. He says his questions were answered.

## 2015-06-11 ENCOUNTER — Emergency Department
Admission: EM | Admit: 2015-06-11 | Discharge: 2015-06-11 | Disposition: A | Discharge status: Home / Self Care | Payer: Commercial Managed Care - HMO | Attending: Emergency Medicine | Admitting: Emergency Medicine

## 2015-06-11 ENCOUNTER — Encounter: Payer: Self-pay | Admitting: Emergency Medicine

## 2015-06-11 DIAGNOSIS — Z7952 Long term (current) use of systemic steroids: Secondary | ICD-10-CM | POA: Insufficient documentation

## 2015-06-11 DIAGNOSIS — Z7982 Long term (current) use of aspirin: Secondary | ICD-10-CM | POA: Insufficient documentation

## 2015-06-11 DIAGNOSIS — M5442 Lumbago with sciatica, left side: Secondary | ICD-10-CM | POA: Insufficient documentation

## 2015-06-11 DIAGNOSIS — M545 Low back pain: Secondary | ICD-10-CM | POA: Diagnosis present

## 2015-06-11 DIAGNOSIS — Z79899 Other long term (current) drug therapy: Secondary | ICD-10-CM | POA: Diagnosis not present

## 2015-06-11 DIAGNOSIS — I10 Essential (primary) hypertension: Secondary | ICD-10-CM | POA: Insufficient documentation

## 2015-06-11 DIAGNOSIS — F17210 Nicotine dependence, cigarettes, uncomplicated: Secondary | ICD-10-CM | POA: Insufficient documentation

## 2015-06-11 MED ORDER — LIDOCAINE 5 % EX PTCH
1.0000 | MEDICATED_PATCH | CUTANEOUS | Status: DC
  Administered 2015-06-11: 1 via TRANSDERMAL
  Filled 2015-06-11 (×2): qty 1

## 2015-06-11 MED ORDER — ETODOLAC 200 MG PO CAPS: 200.0000 mg | ORAL_CAPSULE | Freq: Three times a day (TID) | ORAL | Status: DC

## 2015-06-11 MED ORDER — KETOROLAC TROMETHAMINE 60 MG/2ML IM SOLN
60.0000 mg | Freq: Once | INTRAMUSCULAR | Status: AC
  Administered 2015-06-11: 60 mg via INTRAMUSCULAR
  Filled 2015-06-11: qty 2

## 2015-06-11 MED ORDER — LIDOCAINE 5 % EX PTCH: 1.0000 | MEDICATED_PATCH | CUTANEOUS | Status: DC

## 2015-06-11 MED ORDER — DIAZEPAM 5 MG PO TABS
5.0000 mg | ORAL_TABLET | Freq: Once | ORAL | Status: AC
  Administered 2015-06-11: 5 mg via ORAL
  Filled 2015-06-11: qty 1

## 2015-06-11 NOTE — ED Notes (Signed)
Patient with complaint left lower back pain radiating left leg that started on May 21, 2015. Patient states that he has been seen before for the same pain. Patient reports that the pain became worse yesterday. Patient reports that he has followed up with the pain clinic and was started on steroids. Patient reports that he did have some improvement with the steroids.

## 2015-06-11 NOTE — ED Provider Notes (Signed)
[facility] Emergency Department Provider Note  ____________________________________________  Time seen: Approximately 4:00 AM  I have reviewed the triage vital signs and the nursing notes.   HISTORY  Chief Complaint Back Pain    HPI Craig Nolan is a 50 y.o. male who comes into the hospital today with sciatic nerve pain. The patient reports that it starts in his low back and goes all the way down to his left calf. He reports that he has been unable to sleep even though he's been taking things to help him sleep. He reports that the symptoms started on 312 and has not gone away. The patient had an appointment with the pain center and he was started on steroids but as soon as it was stopped the pain returned. The patient reports that he was holding out but it now as bad as it was before. He has been doing heating pads, ice packs and ointment on his back but it has not been helping. He reports this pain is a 10 out of 10 in intensity. It sharp and stabbing and he does have some numbness to his left lateral leg as well as some mild weakness. The patient reports that he could not tolerate the pain anymore and it was bothering him so he decided to come in and get checked out. The patient has had no problem with urinary or fecal incontinence.   Past Medical History  Diagnosis Date  . Hemorrhoids   . Hypertension   . PTSD (post-traumatic stress disorder)   . Seasonal allergies   . Dyslipidemia   . Hyperlipidemia   . Tobacco use   . Drug abuse     Patient Active Problem List   Diagnosis Date Noted  . Chronic low back pain (Location of Secondary source of pain) (Bilateral) (L>R) 05/25/2015  . 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 Lumbar spondylosis 05/25/2015  . Chronic pain 05/25/2015  . Musculoskeletal pain 05/25/2015  . Neurogenic pain 05/25/2015  . Neuropathic pain 05/25/2015  . Long term prescription benzodiazepine use 05/25/2015  . Encounter for  therapeutic drug level monitoring 05/25/2015  . Chronic lower extremity pain (Location of Primary Source of Pain) (Left) 05/25/2015  . Chronic lumbar radicular pain 05/25/2015  . Marijuana use 05/25/2015  . Cocaine use 05/25/2015  . Illicit drug use [id number]  . Acute back pain with sciatica 05/25/2015  . Insomnia 05/25/2015  . Chronic constipation 05/25/2015  . 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 Muscle spasm of left lower extremity 05/25/2015  . Chronic hip pain (Location of Tertiary source of pain) (Bilateral) (L>R) 05/25/2015  . Bipolar disorder ([place]) 05/25/2015  . Sciatica 03/30/2015  . Pain in the chest 12/17/2013  . Hyperlipidemia   . Tobacco use     Past Surgical History  Procedure Laterality Date  . Colonoscopy    . Back surgery    . Cholecystectomy    . Knee surgery      Current Outpatient Rx  Name  Route  Sig  Dispense  Refill  . acetaminophen (TYLENOL) 500 MG tablet      Take 2 tabs up to three times a day.   100 tablet   6   . aspirin EC 325 MG tablet   Oral   Take 650 mg by mouth as needed.         Marland Kitchen atorvastatin (LIPITOR) 20 MG tablet   Oral   Take 1 tablet (20 mg total) by mouth at bedtime.  30 tablet   6   . bisacodyl (DULCOLAX) 5 MG EC tablet   Oral   Take 2 tablets (10 mg total) by mouth at bedtime as needed for moderate constipation ((Hold for loose stool)).   100 tablet   PRN     Do not place this medication, or any other prescri ...   . buPROPion (WELLBUTRIN XL) 300 MG 24 hr tablet   Oral   Take 300 mg by mouth daily.         . cloNIDine (CATAPRES) 0.2 MG tablet   Oral   Take 0.2 mg by mouth 2 (two) times daily as needed.         . diazepam (VALIUM) 5 MG tablet   Oral   Take 1 tablet (5 mg total) by mouth every 8 (eight) hours as needed for anxiety.   3 tablet   0   . docusate sodium (COLACE) 100 MG capsule   Oral   Take 2 capsules (200 mg total) by mouth at bedtime as needed for moderate constipation. Do  not use longer than 7 days.   60 capsule   PRN     Do not place this medication, or any other prescri ...   . docusate sodium (STOOL SOFTENER) 100 MG capsule   Oral   Take 100 mg by mouth 2 (two) times daily.         . EPITOL 200 MG tablet   Oral   Take 200 mg by mouth daily.           Dispense as written.   . etodolac (LODINE) 200 MG capsule   Oral   Take 1 capsule (200 mg total) by mouth every 8 (eight) hours.   12 capsule   0   . gabapentin (NEURONTIN) 300 MG capsule   Oral   Take 1,200 mg by mouth daily.         Marland Kitchen lidocaine (LIDODERM) 5 %   Transdermal   Place 1 patch onto the skin daily. Remove & Discard patch within 12 hours or as directed by MD   10 patch   0   . Magnesium Oxide 500 MG CAPS   Oral   Take 1 capsule (500 mg total) by mouth daily.   100 capsule   PRN     Do not place this medication, or any other prescri ...   . Melatonin 10 MG CAPS   Oral   Take 20 mg by mouth at bedtime as needed.   60 capsule   2     Do not place this medication, or any other prescri ...   . methylPREDNISolone (MEDROL) 4 MG TBPK tablet      Follow package instructions.   21 tablet   0     Do not place this medication, or any other prescri ...   . Multiple Vitamin (MULTIVITAMIN) tablet   Oral   Take 1 tablet by mouth daily.         . Multiple Vitamins-Minerals (CENTRUM SILVER ADULT 50+ PO)   Oral   Take 1 tablet by mouth daily.         Marland Kitchen omeprazole (PRILOSEC) 20 MG capsule   Oral   Take 20 mg by mouth daily.         . QUEtiapine (SEROQUEL) 50 MG tablet   Oral   Take 50 mg by mouth 2 (two) times daily.         . ranitidine (ZANTAC) 150  MG tablet   Oral   Take 150 mg by mouth at bedtime.         . vitamin C (ASCORBIC ACID) 500 MG tablet   Oral   Take 500 mg by mouth daily.         . Wheat Dextrin (BENEFIBER) POWD      Stir 2 tsp. TID into 4-8 oz of any non-carbonated beverage or soft food (hot or cold)   500 g   PRN     Do not  place this medication, or any other prescri ...     Allergies Review of patient's allergies indicates no known allergies.  Family History  Problem Relation Age of Onset  . Diabetes Mother   . Cancer Father     Social History Social History  Substance Use Topics  . Smoking status: Current Every Day Smoker -- 2.00 packs/day for 20 years    Types: Cigarettes  . Smokeless tobacco: Never Used  . Alcohol Use: No     Comment: stopped 5 years    Review of Systems Constitutional: No fever/chills Eyes: No visual changes. ENT: No sore throat. Cardiovascular: Denies chest pain. Respiratory: Denies shortness of breath. Gastrointestinal: No abdominal pain.  No nausea, no vomiting.  No diarrhea.  No constipation. Genitourinary: Negative for dysuria. Musculoskeletal: back pain. Skin: Negative for rash. Neurological: Negative for headaches, focal weakness or numbness.  10-point ROS otherwise negative.  ____________________________________________   PHYSICAL EXAM:  VITAL SIGNS: ED Triage Vitals  Enc Vitals Group     BP 06/11/15 0235 123/108 mmHg     Pulse Rate 06/11/15 0235 77     Resp 06/11/15 0235 18     Temp 06/11/15 0235 97.7 F (36.5 C)     Temp Source 06/11/15 0235 Oral     SpO2 06/11/15 0235 98 %     Weight 06/11/15 0235 197 lb (89.359 kg)     Height 06/11/15 0235 5\' 9"  (1.753 m)     Head Cir --      Peak Flow --      Pain Score 06/11/15 0236 10     Pain Loc --      Pain Edu? --      Excl. in [place]? --     Constitutional: Alert and oriented. Well appearing and in Mild distress. Eyes: Conjunctivae are normal. PERRL. EOMI. Head: Atraumatic. Nose: No congestion/rhinnorhea. Mouth/Throat: Mucous membranes are moist.  Oropharynx non-erythematous. Cardiovascular: Normal rate, regular rhythm. Grossly normal heart sounds.  Good peripheral circulation. Respiratory: Normal respiratory effort.  No retractions. Lungs CTAB. Gastrointestinal: Soft and nontender. No distention.  Positive bowel sounds Musculoskeletal: No lower extremity tenderness nor edema.  Left SI joint tenderness to palpation with positive straight leg raise on the left. Neurologic:  Normal speech and language.  Skin:  Skin is warm, dry and intact.  Psychiatric: Mood and affect are normal.   ____________________________________________   LABS (all labs ordered are listed, but only abnormal results are displayed)  Labs Reviewed - No data to display ____________________________________________  EKG  None ____________________________________________  RADIOLOGY  None ____________________________________________   PROCEDURES  Procedure(s) performed: None  Critical Care performed: No  ____________________________________________   INITIAL IMPRESSION / ASSESSMENT AND PLAN / ED COURSE  Pertinent labs & imaging results that were available during my care of the patient were reviewed by me and considered in my medical decision making (see chart for details).  This is a 50 year old male who comes into the hospital today with some  back pain. The patient receive a shot of Toradol as well as some Valium and a Lidoderm patch. He will be reassessed once he received his medications.  The patient is sleeping comfortably at this time and his pain is improved. He'll be discharged home. ____________________________________________   FINAL CLINICAL IMPRESSION(S) / ED DIAGNOSES  Final diagnoses:  Left-sided low back pain with left-sided sciatica      Loney Hering, MD 06/11/15 [id number]

## 2015-06-11 NOTE — Discharge Instructions (Signed)
Sciatica °Sciatica is pain, weakness, numbness, or tingling along the path of the sciatic nerve. The nerve starts in the lower back and runs down the back of each leg. The nerve controls the muscles in the lower leg and in the back of the knee, while also providing sensation to the back of the thigh, lower leg, and the sole of your foot. Sciatica is a symptom of another medical condition. For instance, nerve damage or certain conditions, such as a herniated disk or bone spur on the spine, pinch or put pressure on the sciatic nerve. This causes the pain, weakness, or other sensations normally associated with sciatica. Generally, sciatica only affects one side of the body. °CAUSES  °· Herniated or slipped disc. °· Degenerative disk disease. °· A pain disorder involving the narrow muscle in the buttocks (piriformis syndrome). °· Pelvic injury or fracture. °· Pregnancy. °· Tumor (rare). °SYMPTOMS  °Symptoms can vary from mild to very severe. The symptoms usually travel from the low back to the buttocks and down the back of the leg. Symptoms can include: °· Mild tingling or dull aches in the lower back, leg, or hip. °· Numbness in the back of the calf or sole of the foot. °· Burning sensations in the lower back, leg, or hip. °· Sharp pains in the lower back, leg, or hip. °· Leg weakness. °· Severe back pain inhibiting movement. °These symptoms may get worse with coughing, sneezing, laughing, or prolonged sitting or standing. Also, being overweight may worsen symptoms. °DIAGNOSIS  °Your caregiver will perform a physical exam to look for common symptoms of sciatica. He or she may ask you to do certain movements or activities that would trigger sciatic nerve pain. Other tests may be performed to find the cause of the sciatica. These may include: °· Blood tests. °· X-rays. °· Imaging tests, such as an MRI or CT scan. °TREATMENT  °Treatment is directed at the cause of the sciatic pain. Sometimes, treatment is not necessary  and the pain and discomfort goes away on its own. If treatment is needed, your caregiver may suggest: °· Over-the-counter medicines to relieve pain. °· Prescription medicines, such as anti-inflammatory medicine, muscle relaxants, or narcotics. °· Applying heat or ice to the painful area. °· Steroid injections to lessen pain, irritation, and inflammation around the nerve. °· Reducing activity during periods of pain. °· Exercising and stretching to strengthen your abdomen and improve flexibility of your spine. Your caregiver may suggest losing weight if the extra weight makes the back pain worse. °· Physical therapy. °· Surgery to eliminate what is pressing or pinching the nerve, such as a bone spur or part of a herniated disk. °HOME CARE INSTRUCTIONS  °· Only take over-the-counter or prescription medicines for pain or discomfort as directed by your caregiver. °· Apply ice to the affected area for 20 minutes, 3-4 times a day for the first 48-72 hours. Then try heat in the same way. °· Exercise, stretch, or perform your usual activities if these do not aggravate your pain. °· Attend physical therapy sessions as directed by your caregiver. °· Keep all follow-up appointments as directed by your caregiver. °· Do not wear high heels or shoes that do not provide proper support. °· Check your mattress to see if it is too soft. A firm mattress may lessen your pain and discomfort. °SEEK IMMEDIATE MEDICAL CARE IF:  °· You lose control of your bowel or bladder (incontinence). °· You have increasing weakness in the lower back, pelvis, buttocks,   or legs.  You have redness or swelling of your back.  You have a burning sensation when you urinate.  You have pain that gets worse when you lie down or awakens you at night.  Your pain is worse than you have experienced in the past.  Your pain is lasting longer than 4 weeks.  You are suddenly losing weight without reason. MAKE SURE YOU:  Understand these  instructions.  Will watch your condition.  Will get help right away if you are not doing well or get worse.   This information is not intended to replace advice given to you by your health care provider. Make sure you discuss any questions you have with your health care provider.   Document Released: 02/19/2001 Document Revised: 11/16/2014 Document Reviewed: 07/07/2011 Elsevier Interactive Patient Education 2016 [employer]  Radicular Pain Radicular pain in either the arm or leg is usually from a bulging or herniated disk in the spine. A piece of the herniated disk may press against the nerves as the nerves exit the spine. This causes pain which is felt at the tips of the nerves down the arm or leg. Other causes of radicular pain may include:  Fractures.  Heart disease.  Cancer.  An abnormal and usually degenerative state of the nervous system or nerves (neuropathy). Diagnosis may require CT or MRI scanning to determine the primary cause.  Nerves that start at the neck (nerve roots) may cause radicular pain in the outer shoulder and arm. It can spread down to the thumb and fingers. The symptoms vary depending on which nerve root has been affected. In most cases radicular pain improves with conservative treatment. Neck problems may require physical therapy, a neck collar, or cervical traction. Treatment may take many weeks, and surgery may be considered if the symptoms do not improve.  Conservative treatment is also recommended for sciatica. Sciatica causes pain to radiate from the lower back or buttock area down the leg into the foot. Often there is a history of back problems. Most patients with sciatica are better after 2 to 4 weeks of rest and other supportive care. Short term bed rest can reduce the disk pressure considerably. Sitting, however, is not a good position since this increases the pressure on the disk. You should avoid bending, lifting, and all other activities which make the  problem worse. Traction can be used in severe cases. Surgery is usually reserved for patients who do not improve within the first months of treatment. Only take over-the-counter or prescription medicines for pain, discomfort, or fever as directed by your caregiver. Narcotics and muscle relaxants may help by relieving more severe pain and spasm and by providing mild sedation. Cold or massage can give significant relief. Spinal manipulation is not recommended. It can increase the degree of disc protrusion. Epidural steroid injections are often effective treatment for radicular pain. These injections deliver medicine to the spinal nerve in the space between the protective covering of the spinal cord and back bones (vertebrae). Your caregiver can give you more information about steroid injections. These injections are most effective when given within two weeks of the onset of pain.  You should see your caregiver for follow up care as recommended. A program for neck and back injury rehabilitation with stretching and strengthening exercises is an important part of management.  SEEK IMMEDIATE MEDICAL CARE IF:  You develop increased pain, weakness, or numbness in your arm or leg.  You develop difficulty with bladder or bowel control.  You develop abdominal pain.   This information is not intended to replace advice given to you by your health care provider. Make sure you discuss any questions you have with your health care provider.   Document Released: 04/04/2004 Document Revised: 03/18/2014 Document Reviewed: 09/21/2014 Elsevier Interactive Patient Education [employer].

## 2015-06-11 NOTE — ED Notes (Signed)
Pharm called

## 2015-06-16 ENCOUNTER — Other Ambulatory Visit: Payer: Self-pay | Admitting: Family Medicine

## 2015-06-19 MED ORDER — ATORVASTATIN CALCIUM 20 MG PO TABS: 20.0000 mg | ORAL_TABLET | Freq: Every day | ORAL | Status: DC

## 2015-06-20 ENCOUNTER — Ambulatory Visit: Payer: Commercial Managed Care - HMO | Admitting: Pain Medicine

## 2015-06-22 ENCOUNTER — Encounter: Payer: Self-pay | Admitting: Pain Medicine

## 2015-06-22 ENCOUNTER — Ambulatory Visit: Payer: Commercial Managed Care - HMO | Attending: Pain Medicine | Admitting: Pain Medicine

## 2015-06-22 VITALS — BP 141/97 | HR 69 | Temp 97.7°F | Resp 16 | Ht 69.0 in | Wt 197.0 lb

## 2015-06-22 DIAGNOSIS — M25552 Pain in left hip: Secondary | ICD-10-CM | POA: Insufficient documentation

## 2015-06-22 DIAGNOSIS — G8929 Other chronic pain: Secondary | ICD-10-CM | POA: Diagnosis not present

## 2015-06-22 DIAGNOSIS — M79606 Pain in leg, unspecified: Secondary | ICD-10-CM | POA: Diagnosis present

## 2015-06-22 DIAGNOSIS — M4726 Other spondylosis with radiculopathy, lumbar region: Secondary | ICD-10-CM | POA: Diagnosis not present

## 2015-06-22 DIAGNOSIS — F17200 Nicotine dependence, unspecified, uncomplicated: Secondary | ICD-10-CM | POA: Diagnosis not present

## 2015-06-22 DIAGNOSIS — F319 Bipolar disorder, unspecified: Secondary | ICD-10-CM | POA: Diagnosis not present

## 2015-06-22 DIAGNOSIS — M25551 Pain in right hip: Secondary | ICD-10-CM | POA: Insufficient documentation

## 2015-06-22 DIAGNOSIS — M79605 Pain in left leg: Secondary | ICD-10-CM | POA: Insufficient documentation

## 2015-06-22 DIAGNOSIS — K5909 Other constipation: Secondary | ICD-10-CM | POA: Diagnosis not present

## 2015-06-22 DIAGNOSIS — F1490 Cocaine use, unspecified, uncomplicated: Secondary | ICD-10-CM | POA: Insufficient documentation

## 2015-06-22 DIAGNOSIS — M47896 Other spondylosis, lumbar region: Secondary | ICD-10-CM | POA: Insufficient documentation

## 2015-06-22 DIAGNOSIS — M539 Dorsopathy, unspecified: Secondary | ICD-10-CM

## 2015-06-22 DIAGNOSIS — M961 Postlaminectomy syndrome, not elsewhere classified: Secondary | ICD-10-CM

## 2015-06-22 DIAGNOSIS — M791 Myalgia: Secondary | ICD-10-CM | POA: Insufficient documentation

## 2015-06-22 DIAGNOSIS — F1290 Cannabis use, unspecified, uncomplicated: Secondary | ICD-10-CM | POA: Diagnosis not present

## 2015-06-22 DIAGNOSIS — M62831 Muscle spasm of calf: Secondary | ICD-10-CM | POA: Diagnosis not present

## 2015-06-22 DIAGNOSIS — M5440 Lumbago with sciatica, unspecified side: Secondary | ICD-10-CM | POA: Diagnosis not present

## 2015-06-22 DIAGNOSIS — M25559 Pain in unspecified hip: Secondary | ICD-10-CM | POA: Diagnosis present

## 2015-06-22 DIAGNOSIS — R079 Chest pain, unspecified: Secondary | ICD-10-CM | POA: Insufficient documentation

## 2015-06-22 DIAGNOSIS — M5416 Radiculopathy, lumbar region: Secondary | ICD-10-CM | POA: Diagnosis not present

## 2015-06-22 DIAGNOSIS — E785 Hyperlipidemia, unspecified: Secondary | ICD-10-CM | POA: Insufficient documentation

## 2015-06-22 DIAGNOSIS — G4700 Insomnia, unspecified: Secondary | ICD-10-CM | POA: Insufficient documentation

## 2015-06-22 MED ORDER — LIDOCAINE HCL (PF) 1 % IJ SOLN
INTRAMUSCULAR | Status: AC
  Filled 2015-06-22: qty 5

## 2015-06-22 MED ORDER — ROPIVACAINE HCL 2 MG/ML IJ SOLN: 2.0000 mL | Freq: Once | INTRAMUSCULAR | Status: DC

## 2015-06-22 MED ORDER — TRIAMCINOLONE ACETONIDE 40 MG/ML IJ SUSP: 40.0000 mg | Freq: Once | INTRAMUSCULAR | Status: DC

## 2015-06-22 MED ORDER — ROPIVACAINE HCL 2 MG/ML IJ SOLN
INTRAMUSCULAR | Status: AC
  Filled 2015-06-22: qty 10

## 2015-06-22 MED ORDER — SODIUM CHLORIDE 0.9% FLUSH: 2.0000 mL | Freq: Once | INTRAVENOUS | Status: DC

## 2015-06-22 MED ORDER — TRIAMCINOLONE ACETONIDE 40 MG/ML IJ SUSP
INTRAMUSCULAR | Status: AC
  Filled 2015-06-22: qty 1

## 2015-06-22 MED ORDER — IOPAMIDOL (ISOVUE-M 200) INJECTION 41%: 10.0000 mL | Freq: Once | INTRAMUSCULAR | Status: DC | PRN

## 2015-06-22 MED ORDER — SODIUM CHLORIDE 0.9 % IJ SOLN
INTRAMUSCULAR | Status: AC
  Filled 2015-06-22: qty 10

## 2015-06-22 MED ORDER — LIDOCAINE HCL (PF) 1 % IJ SOLN: 10.0000 mL | Freq: Once | INTRAMUSCULAR | Status: DC

## 2015-06-22 NOTE — Progress Notes (Signed)
Patient's Name: Craig Nolan Patient type: Established  MRN: [id number] Service setting: Ambulatory outpatient  DOB: 12/14/1965   DOS: 06/22/2015    Primary Reason(s) for Visit: Interventional Pain Management Treatment. CC: Hip Pain and Leg Pain   Procedure:  Anesthesia, Analgesia, Anxiolysis:  Type: Therapeutic Inter-Laminar Epidural Steroid Injection Region: Lumbar Level: L3-4 Level. Laterality: Left Paramedial  Indications: 1. Chronic lower extremity pain (Location of Primary Source of Pain) (Left)   2. Chronic lumbar radicular pain   3. Failed back surgical syndrome   4. Osteoarthritis of spine with radiculopathy, lumbar region     Pre-procedure Pain Score: 5/10 Reported level of pain is compatible with clinical observations Post-procedure Pain Score: 3   Type: Local Anesthesia Local Anesthetic: Lidocaine 1% Route: Infiltration ([place]/IM) IV Access: Declined Sedation: Declined  Indication(s): Analgesia     Pre-Procedure Assessment:  Craig Nolan is a 50 y.o. year old, male patient, seen today for interventional treatment. He has Pain in the chest; Hyperlipidemia; Tobacco use; Sciatica; Chronic low back pain (Location of Secondary source of pain) (Bilateral) (L>R); Failed back surgical syndrome; Lumbar spondylosis; Chronic pain; Musculoskeletal pain; Neurogenic pain; Neuropathic pain; Long term prescription benzodiazepine use; Encounter for therapeutic drug level monitoring; Chronic lower extremity pain (Location of Primary Source of Pain) (Left); Chronic lumbar radicular pain; Marijuana use; Cocaine use; Illicit drug use; Acute back pain with sciatica; Insomnia; Chronic constipation; Inflammatory spondylopathy (HCC); Muscle spasm of left lower extremity; Chronic hip pain (Location of Tertiary source of pain) (Bilateral) (L>R); and Bipolar disorder (HCC) on his problem list.. His primarily concern today is the Hip Pain and Leg Pain  The patient comes in today clinics today for a  left lumbar epidural steroid injection under fluoroscopic guidance. He has some serious anger problems and he had to be told by the nursing staff a couple times to calm down. He indicates that he ate more than 2 hours ago, but he shows up with a large container Gatorade which he was still sipping on when he arrived. We immediately told him that he needed to stop drinking anything else and when the nursing staff asked him if he had taken any blood thinners such as 325 mg aspirin's recently, he indicated that he had not. However when I walked into the room he told me that he had taken at least 3 of them last night and that he has been taking quite a bit of them for the pain. He had a really bad attitude towards me and he informed me that he had to see 3 different physician before he could get somebody to right's him for some pain medicine. I'm not sure if he realizes that this is telling me that he is more than willing to doctor shop. In any case, I made an appointment to talk to him about the reasons why we do the nothing by mouth status and the possibility of vomiting with subsequent aspiration and chemical pneumonia. I make sure to emphasize that this can lead to death. In addition, I also explained to him about the blood thinners and how this can increase the possibility of intraspinal bleeding with subsequent epidural or intraspinal hematoma that can compress the spinal cord and lead to permanent paralysis. He assured me that even when he takes all of this aspirin he stops bleeding readily and he showed me a couple of clots that he recently had. In addition, he mentioned about a tooth pain on his right upper mandible and I took the opportunity  to mentioned to him that if he had any type of infection that we much rather not do the procedure since the steroids can lead to decrease immune system that can in turn allow for any infections to run rampid. After I told him all of this, he still wants to proceed with the  lumbar epidural steroid injection. I make sure to remind him that this is an elective procedure.  Pain Type: Chronic pain Pain Location: Hip Pain Orientation: Left, Lower Pain Descriptors / Indicators: Aching Pain Frequency: Constant  Date of Last Visit: 05/25/15 Service Provided on Last Visit: Evaluation  Verification of the correct person, correct site (including marking of site), and correct procedure were performed and confirmed by the patient.  Today's Vitals   06/22/15 0934 06/22/15 1102 06/22/15 1106 06/22/15 1111  BP: 127/82 140/90 123/93 141/97  Pulse:  72 69 69  Temp:      Resp:  '18 16 16  '$ Height:      Weight:      SpO2:  95% 97% 97%  PainSc: 5    3   PainLoc:      Calculated BMI: Body mass index is 29.08 kg/(m^2). Allergies: He has No Known Allergies.. Primary Diagnosis: Chronic pain of left lower extremity [M79.605, G89.29]  Consent: Secured. Under the influence of no sedatives a written informed consent was obtained, after having provided information on the risks and possible complications. To fulfill our ethical and legal obligations, as recommended by the American Medical Association's Code of Ethics, we have provided information to the patient about our clinical impression; the nature and purpose of the treatment or procedure; the risks, benefits, and possible complications of the intervention; alternatives; the risk(s) and benefit(s) of the alternative treatment(s) or procedure(s); and the risk(s) and benefit(s) of doing nothing. The patient was provided information about the risks and possible complications associated with the procedure. In the case of spinal procedures these may include, but are not limited to, failure to achieve desired goals, infection, bleeding, organ or nerve damage, allergic reactions, paralysis, and death. In addition, the patient was informed that Medicine is not an exact science; therefore, there is also the possibility of unforeseen risks and  possible complications that may result in a catastrophic outcome. The patient indicated having understood very clearly. We have given the patient no guarantees and we have made no promises. Enough time was given to the patient to ask questions, all of which were answered to the patient's satisfaction.  Pre-Procedure Preparation: Safety Precautions: Allergies reviewed. Appropriate site, procedure, and patient were confirmed by following the Joint Commission's Universal Protocol (UP.01.01.01), in the form of a "Time Out". The patient was asked to confirm marked site and procedure, before commencing. The patient was asked about blood thinners, or active infections, both of which were denied. Patient was assessed for positional comfort and all pressure points were checked before starting procedure. Monitoring:  As per clinic protocol. Infection Control Precautions: Sterile technique used. Standard Universal Precautions were taken as recommended by the Department of [facility] for Disease Control and Prevention (CDC). Standard pre-surgical skin prep was conducted. Respiratory hygiene and cough etiquette was practiced. Hand hygiene observed. Safe injection practices and needle disposal techniques followed. SDV (single dose vial) medications used. Medications properly checked for expiration dates and contaminants. Personal protective equipment (PPE) used: Surgical mask. Sterile double glove technique. Radiation resistant gloves. Sterile surgical gloves.  Description of Procedure Process:  Time-out: "Time-out" completed before starting procedure, as per protocol. Position:  Prone Target Area: For Epidural Steroid injections, the target area is the  interlaminar space, initially targeting the lower border of the superior vertebral body lamina. Approach: Posterior approach. Area Prepped: Entire Posterior Lumbosacral Region Prepping solution: ChloraPrep (2% chlorhexidine gluconate and 70% isopropyl  alcohol) Safety Precautions: Aspiration looking for blood return was conducted prior to all injections. At no point did we inject any substances, as a needle was being advanced. No attempts were made at seeking any paresthesias. Safe injection practices and needle disposal techniques used. Medications properly checked for expiration dates. SDV (single dose vial) medications used.   Description of the Procedure: Protocol guidelines were followed. The patient was placed in position over the fluoroscopy table. The target area was identified and the area prepped in the usual manner. Skin desensitized using vapocoolant spray. Skin & deeper tissues infiltrated with local anesthetic. Appropriate amount of time allowed to pass for local anesthetics to take effect. The procedure needle was introduced through the skin, ipsilateral to the reported pain, and advanced to the target area. Bone was contacted and the needle walked caudad, until the lamina was cleared. The epidural space was identified using "loss-of-resistance technique" with 2-3 ml of PF-NaCl (0.9% NSS), in a 5cc LOR glass syringe. Proper needle placement secured. Negative aspiration confirmed. Solution injected in intermittent fashion, asking for systemic symptoms every 0.5cc of injectate. The needles were then removed and the area cleansed, making sure to leave some of the prepping solution back to take advantage of its long term bactericidal properties. EBL: None Materials & Medications Used:  Needle(s) Used: 20g - 10cm, Tuohy-style epidural needle Medication(s): Please see chart orders for medication and dosing details.  Imaging Guidance:  Type of Imaging Technique: Fluoroscopy Guidance (Spinal) Indication(s): Assistance in needle guidance and placement for procedures requiring needle placement in or near specific anatomical locations not easily accessible without such assistance. Exposure Time: Please see nurses notes. Contrast: None available due  to a hospital wide shortage of Isovue. Fluoroscopic Guidance: I was personally present in the fluoroscopy suite, where the patient was placed in position for the procedure, over the fluoroscopy-compatible table. Fluoroscopy was manipulated, using "Tunnel Vision Technique", to obtain the best possible view of the target area, on the affected side. Parallax error was corrected before commencing the procedure. A "direction-depth-direction" technique was used to introduce the needle under continuous pulsed fluoroscopic guidance. Once the target was reached, antero-posterior, oblique, and lateral fluoroscopic projection views were taken to confirm needle placement in all planes. Permanently recorded images stored by scanning into EMR. Interpretation: Intraoperative imaging interpretation by performing Physician. Adequate needle placement confirmed. Adequate needle placement confirmed in AP, lateral, & Oblique Views. No contrast injected. Permanent hardcopy images in multiple planes scanned into the patient's record.  Antibiotic Prophylaxis:  Indication(s): No indications identified. Type:  Antibiotics Given (last 72 hours)    None       Post-operative Assessment:   Complications: No immediate post-treatment complications were observed. Relevant Post-operative Information:  Disposition: Return to clinic for follow-up evaluation. The patient tolerated the entire procedure well. A repeat set of vitals were taken after the procedure and the patient was kept under observation following institutional policy, for this procedure. Post-procedural neurological assessment was performed, showing return to baseline, prior to discharge. The patient was discharged home, once institutional criteria were met. The patient was provided with post-procedure discharge instructions, including a section on how to identify potential problems. Should any problems arise concerning this procedure, the patient was given instructions  to  immediately contact us, at any time, without hesitation. In any case, we plan to contact the patient by telephone for a follow-up status report regarding this interventional procedure. Comments:  No additional relevant information.  Medications administered during this visit: Mr. Lamons had no medications administered during this visit.  Prescriptions ordered during this visit: New Prescriptions   No medications on file    Future Appointments Date Time Provider [place]  07/11/2015 10:00 AM [facility]  07/17/2015 8:20 AM Milinda Pointer, MD [facility] None    Primary Care Physician: Dicky Doe, MD Location: [facility] Note by: Kathlen [place]. Dossie Arbour, M.D, DABA, DABAPM, DABPM, DABIPP, FIPP  Disclaimer:  Medicine is not an exact science. The only guarantee in medicine is that nothing is guaranteed. It is important to note that the decision to proceed with this intervention was based on the information collected from the patient. The Data and conclusions were drawn from the patient's questionnaire, the interview, and the physical examination. Because the information was provided in large part by the patient, it cannot be guaranteed that it has not been purposely or unconsciously manipulated. Every effort has been made to obtain as much relevant data as possible for this evaluation. It is important to note that the conclusions that lead to this procedure are derived in large part from the available data. Always take into account that the treatment will also be dependent on availability of resources and existing treatment guidelines, considered by other Pain Management Practitioners as being common knowledge and practice, at the time of the intervention. For Medico-Legal purposes, it is also important to point out that variation in procedural techniques and pharmacological choices are the acceptable norm. The indications, contraindications,  technique, and results of the above procedure should only be interpreted and judged by a Board-Certified Interventional Pain Specialist with extensive familiarity and expertise in the same exact procedure and technique. Attempts at providing opinions without similar or greater experience and expertise than that of the treating physician will be considered as inappropriate and unethical, and shall result in a formal complaint to the state medical board and applicable specialty societies.

## 2015-06-22 NOTE — Progress Notes (Deleted)
Safety precautions to be maintained throughout the outpatient stay will include: orient to surroundings, keep bed in low position, maintain call bell within reach at all times, provide assistance with transfer out of bed and ambulation.  

## 2015-06-22 NOTE — Patient Instructions (Addendum)
Smoking Cessation, Tips for Success If you are ready to quit smoking, congratulations! You have chosen to help yourself be healthier. Cigarettes bring nicotine, tar, carbon monoxide, and other irritants into your body. Your lungs, heart, and blood vessels will be able to work better without these poisons. There are many different ways to quit smoking. Nicotine gum, nicotine patches, a nicotine inhaler, or nicotine nasal spray can help with physical craving. Hypnosis, support groups, and medicines help break the habit of smoking. WHAT THINGS CAN I DO TO MAKE QUITTING EASIER?  Here are some tips to help you quit for good:  Pick a date when you will quit smoking completely. Tell all of your friends and family about your plan to quit on that date.  Do not try to slowly cut down on the number of cigarettes you are smoking. Pick a quit date and quit smoking completely starting on that day.  Throw away all cigarettes.   Clean and remove all ashtrays from your home, work, and car.  On a card, write down your reasons for quitting. Carry the card with you and read it when you get the urge to smoke.  Cleanse your body of nicotine. Drink enough water and fluids to keep your urine clear or pale yellow. Do this after quitting to flush the nicotine from your body.  Learn to predict your moods. Do not let a bad situation be your excuse to have a cigarette. Some situations in your life might tempt you into wanting a cigarette.  Never have "just one" cigarette. It leads to wanting another and another. Remind yourself of your decision to quit.  Change habits associated with smoking. If you smoked while driving or when feeling stressed, try other activities to replace smoking. Stand up when drinking your coffee. Brush your teeth after eating. Sit in a different chair when you read the paper. Avoid alcohol while trying to quit, and try to drink fewer caffeinated beverages. Alcohol and caffeine may urge you to  smoke.  Avoid foods and drinks that can trigger a desire to smoke, such as sugary or spicy foods and alcohol.  Ask people who smoke not to smoke around you.  Have something planned to do right after eating or having a cup of coffee. For example, plan to take a walk or exercise.  Try a relaxation exercise to calm you down and decrease your stress. Remember, you may be tense and nervous for the first 2 weeks after you quit, but this will pass.  Find new activities to keep your hands busy. Play with a pen, coin, or rubber band. Doodle or draw things on paper.  Brush your teeth right after eating. This will help cut down on the craving for the taste of tobacco after meals. You can also try mouthwash.   Use oral substitutes in place of cigarettes. Try using lemon drops, carrots, cinnamon sticks, or chewing gum. Keep them handy so they are available when you have the urge to smoke.  When you have the urge to smoke, try deep breathing.  Designate your home as a nonsmoking area.  If you are a heavy smoker, ask your health care provider about a prescription for nicotine chewing gum. It can ease your withdrawal from nicotine.  Reward yourself. Set aside the cigarette money you save and buy yourself something nice.  Look for support from others. Join a support group or smoking cessation program. Ask someone at home or at work to help you with your plan   to quit smoking.  Always ask yourself, "Do I need this cigarette or is this just a reflex?" Tell yourself, "Today, I choose not to smoke," or "I do not want to smoke." You are reminding yourself of your decision to quit.  Do not replace cigarette smoking with electronic cigarettes (commonly called e-cigarettes). The safety of e-cigarettes is unknown, and some may contain harmful chemicals.  If you relapse, do not give up! Plan ahead and think about what you will do the next time you get the urge to smoke. HOW WILL I FEEL WHEN I QUIT SMOKING? You  may have symptoms of withdrawal because your body is used to nicotine (the addictive substance in cigarettes). You may crave cigarettes, be irritable, feel very hungry, cough often, get headaches, or have difficulty concentrating. The withdrawal symptoms are only temporary. They are strongest when you first quit but will go away within 10-14 days. When withdrawal symptoms occur, stay in control. Think about your reasons for quitting. Remind yourself that these are signs that your body is healing and getting used to being without cigarettes. Remember that withdrawal symptoms are easier to treat than the major diseases that smoking can cause.  Even after the withdrawal is over, expect periodic urges to smoke. However, these cravings are generally short lived and will go away whether you smoke or not. Do not smoke! WHAT RESOURCES ARE AVAILABLE TO HELP ME QUIT SMOKING? Your health care provider can direct you to community resources or hospitals for support, which may include:  Group support.  Education.  Hypnosis.  Therapy.   This information is not intended to replace advice given to you by your health care provider. Make sure you discuss any questions you have with your health care provider.   Document Released: 11/24/2003 Document Revised: 03/18/2014 Document Reviewed: 08/13/2012 Elsevier Interactive Patient Education [employer].   Please complete your post procedure diary. Pain Management Discharge Instructions  General Discharge Instructions :  If you need to reach your doctor call: Monday-Friday 8:00 am - 4:00 pm at [phone] or toll free [phone].  After clinic hours [phone] to have operator reach doctor.  Bring all of your medication bottles to all your appointments in the pain clinic.  To cancel or reschedule your appointment with Pain Management please remember to call 24 hours in advance to avoid a fee.  Refer to the educational materials which you have been  given on: General Risks, I had my Procedure. Discharge Instructions, Post Sedation.  Post Procedure Instructions:  The drugs you were given will stay in your system until tomorrow, so for the next 24 hours you should not drive, make any legal decisions or drink any alcoholic beverages.  You may eat anything you prefer, but it is better to start with liquids then soups and crackers, and gradually work up to solid foods.  Please notify your doctor immediately if you have any unusual bleeding, trouble breathing or pain that is not related to your normal pain.  Depending on the type of procedure that was done, some parts of your body may feel week and/or numb.  This usually clears up by tonight or the next day.  Walk with the use of an assistive device or accompanied by an adult for the 24 hours.  You may use ice on the affected area for the first 24 hours.  Put ice in a Ziploc bag and cover with a towel and place against area 15 minutes on 15 minutes off.  You may  switch to heat after 24 hours.

## 2015-06-22 NOTE — Progress Notes (Deleted)
   Subjective:    Patient ID: Craig Nolan, male    DOB: [birth year], 50 y.o.   MRN: [id number]  HPI    Review of Systems     Objective:   Physical Exam        Assessment & Plan:

## 2015-06-22 NOTE — Progress Notes (Signed)
Patient ID: Craig Nolan, male   DOB: 05-06-1965, 50 y.o.   MRN: [id number] Today we had an incident with this patient where the husband of Ms. Craig Nolan, came to [place] to notify us that [name] had approached him and asked him to give him a clean urine sample for him to use at our clinic. Needless to say this is intolerable.   Risk Assessment: Aberrant Behavior: use of illicit substances, diminised ability to recongnize a problem with one's behavior or  use of the medication, dysfunctional emotional responses, drug seeking behavior, doctor shopping, claims that "nothing else works", extensive time discussing medication  and aggressive complaining about need for higher doses or stronger medication. In addition, he approached the husband of one of our other patients and asked them to provide him with a urine sample. Substance Use Disorder (SUD) Risk Level: Very High Risk of opioid abuse or dependence: 0.7-3.0% with doses ? 36 MME/day and 6.1-26% with doses ? 120 MME/day. Opioid Risk Tool (ORT) Score:  8 High Risk for Opioid Abuse (Score >8) Depression Scale Score: PHQ-2: PHQ-2 Total Score: 6 78.6% Probability of major depressive disorder (6) PHQ-9: PHQ-9 Total Score: 24 Severe depression (20-27). (Risk: 2.4 times more likely to use opioids for reasons other than pain control and 2.89 times more likely to use more opioids than prescribed  Pharmacologic Plan: Decision has been made not to ever prescribe any controlled substances to this patient.

## 2015-06-27 ENCOUNTER — Telehealth: Payer: Self-pay | Admitting: *Deleted

## 2015-06-27 NOTE — Telephone Encounter (Signed)
Voicemail left for patient to call our office if there are questions or concerns re; procedure on 06/22/15

## 2015-07-11 ENCOUNTER — Ambulatory Visit: Payer: Commercial Managed Care - HMO

## 2015-07-17 ENCOUNTER — Ambulatory Visit: Payer: Commercial Managed Care - HMO | Attending: Pain Medicine | Admitting: Pain Medicine

## 2015-07-17 VITALS — BP 112/71 | HR 66 | Temp 98.2°F | Resp 16 | Ht 69.0 in | Wt 198.0 lb

## 2015-07-17 DIAGNOSIS — M79605 Pain in left leg: Secondary | ICD-10-CM | POA: Insufficient documentation

## 2015-07-17 DIAGNOSIS — M549 Dorsalgia, unspecified: Secondary | ICD-10-CM | POA: Diagnosis present

## 2015-07-17 DIAGNOSIS — M545 Low back pain, unspecified: Secondary | ICD-10-CM

## 2015-07-17 DIAGNOSIS — R079 Chest pain, unspecified: Secondary | ICD-10-CM | POA: Insufficient documentation

## 2015-07-17 DIAGNOSIS — F1290 Cannabis use, unspecified, uncomplicated: Secondary | ICD-10-CM | POA: Diagnosis not present

## 2015-07-17 DIAGNOSIS — F4310 Post-traumatic stress disorder, unspecified: Secondary | ICD-10-CM | POA: Diagnosis not present

## 2015-07-17 DIAGNOSIS — G4700 Insomnia, unspecified: Secondary | ICD-10-CM | POA: Insufficient documentation

## 2015-07-17 DIAGNOSIS — M5416 Radiculopathy, lumbar region: Secondary | ICD-10-CM | POA: Insufficient documentation

## 2015-07-17 DIAGNOSIS — F17210 Nicotine dependence, cigarettes, uncomplicated: Secondary | ICD-10-CM | POA: Insufficient documentation

## 2015-07-17 DIAGNOSIS — Z9049 Acquired absence of other specified parts of digestive tract: Secondary | ICD-10-CM | POA: Diagnosis not present

## 2015-07-17 DIAGNOSIS — M62831 Muscle spasm of calf: Secondary | ICD-10-CM | POA: Insufficient documentation

## 2015-07-17 DIAGNOSIS — R785 Finding of other psychotropic drug in blood: Secondary | ICD-10-CM | POA: Diagnosis not present

## 2015-07-17 DIAGNOSIS — M47896 Other spondylosis, lumbar region: Secondary | ICD-10-CM | POA: Diagnosis not present

## 2015-07-17 DIAGNOSIS — G8929 Other chronic pain: Secondary | ICD-10-CM | POA: Diagnosis not present

## 2015-07-17 DIAGNOSIS — E663 Overweight: Secondary | ICD-10-CM | POA: Diagnosis not present

## 2015-07-17 DIAGNOSIS — F1490 Cocaine use, unspecified, uncomplicated: Secondary | ICD-10-CM | POA: Insufficient documentation

## 2015-07-17 DIAGNOSIS — M25551 Pain in right hip: Secondary | ICD-10-CM | POA: Diagnosis not present

## 2015-07-17 DIAGNOSIS — M25559 Pain in unspecified hip: Secondary | ICD-10-CM | POA: Diagnosis present

## 2015-07-17 DIAGNOSIS — Z6829 Body mass index (BMI) 29.0-29.9, adult: Secondary | ICD-10-CM | POA: Diagnosis not present

## 2015-07-17 DIAGNOSIS — M25552 Pain in left hip: Secondary | ICD-10-CM | POA: Diagnosis not present

## 2015-07-17 DIAGNOSIS — I10 Essential (primary) hypertension: Secondary | ICD-10-CM | POA: Diagnosis not present

## 2015-07-17 DIAGNOSIS — M5430 Sciatica, unspecified side: Secondary | ICD-10-CM | POA: Insufficient documentation

## 2015-07-17 DIAGNOSIS — F319 Bipolar disorder, unspecified: Secondary | ICD-10-CM | POA: Insufficient documentation

## 2015-07-17 DIAGNOSIS — K5909 Other constipation: Secondary | ICD-10-CM | POA: Diagnosis not present

## 2015-07-17 NOTE — Progress Notes (Signed)
Safety precautions to be maintained throughout the outpatient stay will include: orient to surroundings, keep bed in low position, maintain call bell within reach at all times, provide assistance with transfer out of bed and ambulation. Patient did not bring meds into office today.

## 2015-07-17 NOTE — Progress Notes (Signed)
Patient's Name: Craig Nolan  Patient type: Established  MRN: [id number]  Service setting: Ambulatory outpatient  DOB: 27-Apr-1965  Location: [facility]  DOS: 07/17/2015  Primary Care Physician: Dicky Doe, MD  Note by: Kathlen Brunswick. Dossie Arbour, M.D, DABA, [place], DABPM, DABIPP, FIPP  Referring Physician: Arlis Porta., MD  Specialty: Board-Certified Interventional Pain Management  Last Visit to Pain Management: 06/27/2015   Primary Reason(s) for Visit: Encounter for post-procedure evaluation of chronic illness with mild to moderate exacerbation CC: Back Pain and Hip Pain   HPI  [name] is a 50 y.o. year old, male patient, who returns today as an established patient. 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 His primarily concern today is the Back Pain and Hip Pain   Pain Assessment: Self-Reported Pain Score: 2  Reported level is compatible with observation Pain Type: Chronic pain Pain Location: Back Pain Orientation: Lower, Left Pain Descriptors / Indicators: Aching, Constant, Radiating, Dull Pain Frequency: Constant  The patient returns to the clinic today after the lumbar epidural steroid injection. He indicates getting about 80% relief  of the pain. At this point he doesn't need anything else and therefore we will see him on a when necessary basis, should he need another injection. He cancel the MRI because of the co-pay. He indicated he couldn't afford it.  Date of Last Visit: 06/22/15 Service Provided on Last Visit: Procedure (LESI)  Post-Procedure Assessment  Procedure done on last visit: Left L3-4 lumbar epidural steroid injection under fluoroscopic guidance without IV sedation. Side-effects or Adverse reactions: None reported Sedation: No sedation used  Results: Ultra-Short Term Relief (First 1 hour after procedure): 40 %  Analgesia during this period is likely to be Local Anesthetic and/or IV Sedative (Analgesic/Anxiolitic) related Short Term Relief (Initial 4-6 hrs after procedure): 50 % Complete relief confirms area to be the source of pain Long Term Relief : 80 % Long-term benefit would suggest an inflammatory etiology to the pain   Current Relief (Now): 80%  Persistent relief would suggest effective anti-inflammatory effects from steroids Interpretation of Results: Clearly this is an effective way of treating this and therefore we will repeat when necessary as needed.  Laboratory Chemistry  Inflammation Markers Lab Results  Component Value Date   ESRSEDRATE 5 05/25/2015   CRP <0.5 05/25/2015    Renal Function Lab Results  Component Value Date   BUN 7 05/25/2015   CREATININE 0.80 05/25/2015   GFRAA >60 05/25/2015   GFRNONAA >60 05/25/2015    Hepatic Function Lab Results  Component Value Date   AST 23 05/25/2015   ALT 14* 05/25/2015   ALBUMIN 4.0 05/25/2015    Electrolytes Lab Results  Component Value Date   NA 137 05/25/2015   K 3.0* 05/25/2015   CL 97* 05/25/2015   CALCIUM 9.0 05/25/2015   MG 1.8 05/25/2015    Pain Modulating Vitamins Lab Results  Component  Value Date   VD125OH2TOT 102 05/25/2015   [id number] 102 05/25/2015   [id number] <10 05/25/2015   VITAMINB12 386 05/25/2015      Coagulation Parameters No results found for: INR, LABPROT  Note: I personally reviewed the above data. Results made available to patient.  Recent Diagnostic Imaging  No results found.  Meds  The patient has a current medication list which includes the following prescription(s): atorvastatin, bupropion, clonidine, diazepam, docusate sodium, docusate sodium, epitol, gabapentin, magnesium oxide, melatonin, multiple vitamins-minerals, omeprazole, quetiapine, vitamin c, benefiber, and methylprednisolone.  Current Outpatient Prescriptions on File Prior to Visit  Medication Sig  . atorvastatin (LIPITOR) 20 MG tablet Take 1 tablet (20 mg total) by mouth at bedtime.  Marland Kitchen buPROPion (WELLBUTRIN XL) 300 MG 24 hr tablet Take 300 mg by mouth daily.  . cloNIDine (CATAPRES) 0.2 MG tablet Take 0.2 mg by mouth 2 (two) times daily as needed.  . diazepam (VALIUM) 5 MG tablet Take 1 tablet (5 mg total) by mouth every 8 (eight) hours as needed for anxiety.  . docusate sodium (COLACE) 100 MG capsule Take 2 capsules (200 mg total) by mouth at bedtime as needed for moderate constipation. Do not use longer than 7 days.  Marland Kitchen docusate sodium (STOOL SOFTENER) 100 MG capsule Take 100 mg by mouth 2 (two) times daily.  . EPITOL 200 MG tablet Take 200 mg by mouth daily.  Marland Kitchen gabapentin (NEURONTIN) 300 MG capsule Take 1,200 mg by mouth daily.  . Magnesium Oxide 500 MG CAPS Take 1 capsule (500 mg total) by mouth daily.  . Melatonin 10 MG CAPS Take 20 mg by mouth at bedtime as needed.  . Multiple Vitamins-Minerals (CENTRUM SILVER ADULT 50+ PO) Take 1 tablet by mouth daily.  Marland Kitchen omeprazole (PRILOSEC) 20 MG capsule Take 20 mg by mouth daily.  . QUEtiapine (SEROQUEL) 50 MG tablet Take 50 mg by mouth 2 (two) times daily.  . vitamin C (ASCORBIC ACID) 500 MG tablet Take 500 mg by mouth daily.  . Wheat Dextrin (BENEFIBER) POWD Stir 2 tsp. TID into 4-8 oz of any non-carbonated beverage or soft food (hot or cold) (Patient taking  differently: as needed. Stir 2 tsp. TID into 4-8 oz of any non-carbonated beverage or soft food (hot or cold))  . methylPREDNISolone (MEDROL) 4 MG TBPK tablet Follow package instructions. (Patient not taking: Reported on 07/17/2015)   No current facility-administered medications on file prior to visit.    ROS  Constitutional: Denies any fever or chills Gastrointestinal: No reported hemesis, hematochezia, vomiting, or acute GI distress Musculoskeletal: Denies any acute onset joint swelling, redness, loss of ROM, or weakness Neurological: No reported episodes of acute onset apraxia, aphasia, dysarthria, agnosia, amnesia, paralysis, loss of coordination, or loss of consciousness  Allergies  Mr. Gillmore has No Known Allergies.  [place]  Medical:  Mr. Rackham  has a past medical history of Hemorrhoids; Hypertension; PTSD (post-traumatic stress disorder); Seasonal allergies; Dyslipidemia; Hyperlipidemia; Tobacco use; and Drug abuse. Family: family history includes Cancer in his father; Diabetes in his mother. Surgical:  has past surgical history that includes Colonoscopy; Back surgery; Cholecystectomy; and Knee surgery. Tobacco:  reports that he has been smoking Cigarettes.  He has a 40 pack-year smoking history. He has never used smokeless tobacco. Alcohol:  reports that he does not drink alcohol. Drug:  reports that he does not use illicit drugs.  Constitutional Exam  Vitals: Blood pressure 112/71, pulse 66, temperature 98.2 F (36.8 C), temperature source Oral, resp. rate 16, height 5'  9" (1.753 m), weight 198 lb (89.812 kg), SpO2 100 %. General appearance: Well nourished, well developed, and well hydrated. In no acute distress Calculated BMI/Body habitus: Body mass index is 29.23 kg/(m^2). (25-29.9 kg/m2) Overweight - 20% higher incidence of chronic pain Psych/Mental status: Alert and oriented x 3 (person, place, & time) Eyes: PERLA Respiratory: No evidence of acute respiratory  distress  Cervical Spine Exam  Inspection: No masses, redness, or swelling Alignment: Symmetrical ROM: Functional: Adequate ROM Active: Unrestricted ROM Stability: No instability detected Muscle strength & Tone: Functionally intact Sensory: Unimpaired Palpation: No complaints of tenderness  Upper Extremity (UE) Exam    Side: Right upper extremity  Side: Left upper extremity  Inspection: No masses, redness, swelling, or asymmetry  Inspection: No masses, redness, swelling, or asymmetry  ROM:  ROM:  Functional: Adequate ROM  Functional: Adequate ROM  Active: Unrestricted ROM  Active: Unrestricted ROM  Muscle strength & Tone: Functionally intact  Muscle strength & Tone: Functionally intact  Sensory: Unimpaired  Sensory: Unimpaired  Palpation: Non-contributory  Palpation: Non-contributory   Thoracic Spine Exam  Inspection: No masses, redness, or swelling Alignment: Symmetrical ROM: Functional: Adequate ROM Active: Unrestricted ROM Stability: No instability detected Sensory: Unimpaired Muscle strength & Tone: Functionally intact Palpation: No complaints of tenderness  Lumbar Spine Exam  Inspection: No masses, redness, or swelling Alignment: Symmetrical ROM: Functional: Adequate ROM Active: Unrestricted ROM Stability: No instability detected Muscle strength & Tone: Functionally intact Sensory: Unimpaired Palpation: No complaints of tenderness Provocative Tests: Lumbar Hyperextension and rotation test: deferred Patrick's Maneuver: deferred  Gait & Posture Assessment  Gait: Unaffected Posture: WNL  Lower Extremity Exam    Side: Right lower extremity  Side: Left lower extremity  Inspection: No masses, redness, swelling, or asymmetry ROM:  Inspection: No masses, redness, swelling, or asymmetry ROM:  Functional: Adequate ROM  Functional: Adequate ROM  Active: Unrestricted ROM  Active: Unrestricted ROM  Muscle strength & Tone: Functionally intact  Muscle strength & Tone:  Functionally intact  Sensory: Unimpaired  Sensory: Unimpaired  Palpation: Non-contributory  Palpation: Non-contributory   Assessment & Plan  Primary Diagnosis & Pertinent Problem List: The primary encounter diagnosis was Chronic lower extremity pain (Location of Primary Source of Pain) (Left). Diagnoses of Chronic low back pain (Location of Secondary source of pain) (Bilateral) (L>R) and Chronic lumbar radicular pain were also pertinent to this visit.  Visit Diagnosis: 1. Chronic lower extremity pain (Location of Primary Source of Pain) (Left)   2. Chronic low back pain (Location of Secondary source of pain) (Bilateral) (L>R)   3. Chronic lumbar radicular pain     Problem-specific Plan(s): No problem-specific assessment & plan notes found for this encounter.   Plan of Care   Problem List Items Addressed This Visit      High   Chronic low back pain (Location of Secondary source of pain) (Bilateral) (L>R) (Chronic)   Relevant Orders   LUMBAR EPIDURAL STEROID INJECTION   Chronic lower extremity pain (Location of Primary Source of Pain) (Left) - Primary (Chronic)   Relevant Orders   LUMBAR EPIDURAL STEROID INJECTION   Chronic lumbar radicular pain (Chronic)   Relevant Orders   LUMBAR EPIDURAL STEROID INJECTION       Pharmacotherapy (Medications Ordered): No orders of the defined types were placed in this encounter.    Lab-work & Procedure Ordered: Orders Placed This Encounter  Procedures  . LUMBAR EPIDURAL STEROID INJECTION    Standing Status: Standing     Number of Occurrences: 1  Standing Expiration Date: 07/16/2016    Scheduling Instructions:     Side: Left-sided (L3-4)     Sedation: With Sedation.     Timeframe: PRN Procedure. Patient will call to schedule.    Order Specific Question:  Where will this procedure be performed?    Answer:  [facility]    Imaging Ordered: None  Interventional Therapies: Scheduled:  None at this time.    Considering:   Palliative lumbar epidural steroid injection, as needed.    PRN Procedures:  Left L3-4 palliative lumbar epidural steroid injection under fluoroscopic guidance, no sedation.    Referral(s) or Consult(s): None at this time.  Medications administered during this visit: Mr. Granados had no medications administered during this visit.  Requested PM Follow-up: Return for Procedure (PRN - Patient will call).  No future appointments.  Primary Care Physician: Dicky Doe, MD Location: [facility] Note by: Kathlen Brunswick. Dossie Arbour, M.D, DABA, DABAPM, DABPM, DABIPP, FIPP  Pain Score Disclaimer: We use the NRS-11 scale. This is a self-reported, subjective measurement of pain severity with only modest accuracy. It is used primarily to identify changes within a particular patient. It must be understood that outpatient pain scales are significantly less accurate that those used for research, where they can be applied under ideal controlled circumstances with minimal exposure to variables. In reality, the score is likely to be a combination of pain intensity and pain affect, where pain affect describes the degree of emotional arousal or changes in action readiness caused by the sensory experience of pain. Factors such as social and work situation, setting, emotional state, anxiety levels, expectation, and prior pain experience may influence pain perception and show large inter-individual differences that may also be affected by time variables.  Patient instructions provided during this appointment: Patient Instructions  Steps to Quit Smoking  Smoking tobacco can be harmful to your health and can affect almost every organ in your body. Smoking puts you, and those around you, at risk for developing many serious chronic diseases. Quitting smoking is difficult, but it is one of the best things that you can do for your health. It is never too late to quit. WHAT ARE THE BENEFITS OF  QUITTING SMOKING? When you quit smoking, you lower your risk of developing serious diseases and conditions, such as:  Lung cancer or lung disease, such as COPD.  Heart disease.  Stroke.  Heart attack.  Infertility.  Osteoporosis and bone fractures. Additionally, symptoms such as coughing, wheezing, and shortness of breath may get better when you quit. You may also find that you get sick less often because your body is stronger at fighting off colds and infections. If you are pregnant, quitting smoking can help to reduce your chances of having a baby of low birth weight. HOW DO I GET READY TO QUIT? When you decide to quit smoking, create a plan to make sure that you are successful. Before you quit:  Pick a date to quit. Set a date within the next two weeks to give you time to prepare.  Write down the reasons why you are quitting. Keep this list in places where you will see it often, such as on your bathroom mirror or in your car or wallet.  Identify the people, places, things, and activities that make you want to smoke (triggers) and avoid them. Make sure to take these actions:  Throw away all cigarettes at home, at work, and in your car.  Throw away smoking  accessories, such as Scientist, research (medical).  Clean your car and make sure to empty the ashtray.  Clean your home, including curtains and carpets.  Tell your family, friends, and coworkers that you are quitting. Support from your loved ones can make quitting easier.  Talk with your health care provider about your options for quitting smoking.  Find out what treatment options are covered by your health insurance. WHAT STRATEGIES CAN I USE TO QUIT SMOKING?  Talk with your healthcare provider about different strategies to quit smoking. Some strategies include:  Quitting smoking altogether instead of gradually lessening how much you smoke over a period of time. Research shows that quitting "cold [place]" is more successful than  gradually quitting.  Attending in-person counseling to help you build problem-solving skills. You are more likely to have success in quitting if you attend several counseling sessions. Even short sessions of 10 minutes can be effective.  Finding resources and support systems that can help you to quit smoking and remain smoke-free after you quit. These resources are most helpful when you use them often. They can include:  Online chats with a Social worker.  Telephone quitlines.  Printed Furniture conservator/restorer.  Support groups or group counseling.  Text messaging programs.  Mobile phone applications.  Taking medicines to help you quit smoking. (If you are pregnant or breastfeeding, talk with your health care provider first.) Some medicines contain nicotine and some do not. Both types of medicines help with cravings, but the medicines that include nicotine help to relieve withdrawal symptoms. Your health care provider may recommend:  Nicotine patches, gum, or lozenges.  Nicotine inhalers or sprays.  Non-nicotine medicine that is taken by mouth. Talk with your health care provider about combining strategies, such as taking medicines while you are also receiving in-person counseling. Using these two strategies together makes you more likely to succeed in quitting than if you used either strategy on its own. If you are pregnant or breastfeeding, talk with your health care provider about finding counseling or other support strategies to quit smoking. Do not take medicine to help you quit smoking unless told to do so by your health care provider. WHAT THINGS CAN I DO TO MAKE IT EASIER TO QUIT? Quitting smoking might feel overwhelming at first, but there is a lot that you can do to make it easier. Take these important actions:  Reach out to your family and friends and ask that they support and encourage you during this time. Call telephone quitlines, reach out to support groups, or work with a  counselor for support.  Ask people who smoke to avoid smoking around you.  Avoid places that trigger you to smoke, such as bars, parties, or smoke-break areas at work.  Spend time around people who do not smoke.  Lessen stress in your life, because stress can be a smoking trigger for some people. To lessen stress, try:  Exercising regularly.  Deep-breathing exercises.  Yoga.  Meditating.  Performing a body scan. This involves closing your eyes, scanning your body from head to toe, and noticing which parts of your body are particularly tense. Purposefully relax the muscles in those areas.  Download or purchase mobile phone or tablet apps (applications) that can help you stick to your quit plan by providing reminders, tips, and encouragement. There are many free apps, such as QuitGuide from the [employer] Office manager for Disease Control and Prevention). You can find other support for quitting smoking (smoking cessation) through smokefree.gov and other websites. HOW WILL  I FEEL WHEN I QUIT SMOKING? Within the first 24 hours of quitting smoking, you may start to feel some withdrawal symptoms. These symptoms are usually most noticeable 2-3 days after quitting, but they usually do not last beyond 2-3 weeks. Changes or symptoms that you might experience include:  Mood swings.  Restlessness, anxiety, or irritation.  Difficulty concentrating.  Dizziness.  Strong cravings for sugary foods in addition to nicotine.  Mild weight gain.  Constipation.  Nausea.  Coughing or a sore throat.  Changes in how your medicines work in your body.  A depressed mood.  Difficulty sleeping (insomnia). After the first 2-3 weeks of quitting, you may start to notice more positive results, such as:  Improved sense of smell and taste.  Decreased coughing and sore throat.  Slower heart rate.  Lower blood pressure.  Clearer skin.  The ability to breathe more easily.  Fewer sick days. Quitting smoking  is very challenging for most people. Do not get discouraged if you are not successful the first time. Some people need to make many attempts to quit before they achieve long-term success. Do your best to stick to your quit plan, and talk with your health care provider if you have any questions or concerns.   This information is not intended to replace advice given to you by your health care provider. Make sure you discuss any questions you have with your health care provider.   Document Released: 02/19/2001 Document Revised: 07/12/2014 Document Reviewed: 07/12/2014 Elsevier Interactive Patient Education 2016 [place] Can Quit Smoking If you are ready to quit smoking or are thinking about it, congratulations! You have chosen to help yourself be healthier and live longer! There are lots of different ways to quit smoking. Nicotine gum, nicotine patches, a nicotine inhaler, or nicotine nasal spray can help with physical craving. Hypnosis, support groups, and medicines help break the habit of smoking. TIPS TO GET OFF AND STAY OFF CIGARETTES  Learn to predict your moods. Do not let a bad situation be your excuse to have a cigarette. Some situations in your life might tempt you to have a cigarette.  Ask friends and co-workers not to smoke around you.  Make your home smoke-free.  Never have "just one" cigarette. It leads to wanting another and another. Remind yourself of your decision to quit.  On a card, make a list of your reasons for not smoking. Read it at least the same number of times a day as you have a cigarette. Tell yourself everyday, "I do not want to smoke. I choose not to smoke."  Ask someone at home or work to help you with your plan to quit smoking.  Have something planned after you eat or have a cup of coffee. Take a walk or get other exercise to perk you up. This will help to keep you from overeating.  Try a relaxation exercise to calm you down and decrease your stress.  Remember, you may be tense and nervous the first two weeks after you quit. This will pass.  Find new activities to keep your hands busy. Play with a pen, coin, or rubber band. Doodle or draw things on paper.  Brush your teeth right after eating. This will help cut down the craving for the taste of tobacco after meals. You can try mouthwash too.  Try gum, breath mints, or diet candy to keep something in your mouth. IF YOU SMOKE AND WANT TO QUIT:  Do not stock up on cigarettes.  Never buy a carton. Wait until one pack is finished before you buy another.  Never carry cigarettes with you at work or at home.  Keep cigarettes as far away from you as possible. Leave them with someone else.  Never carry matches or a lighter with you.  Ask yourself, "Do I need this cigarette or is this just a reflex?"  Bet with someone that you can quit. Put cigarette money in a piggy bank every morning. If you smoke, you give up the money. If you do not smoke, by the end of the week, you keep the money.  Keep trying. It takes 21 days to change a habit!  Talk to your doctor about using medicines to help you quit. These include nicotine replacement gum, lozenges, or skin patches.   This information is not intended to replace advice given to you by your health care provider. Make sure you discuss any questions you have with your health care provider.   Document Released: 12/22/2008 Document Revised: 05/20/2011 Document Reviewed: 12/22/2008 Elsevier Interactive Patient Education [employer].

## 2015-07-17 NOTE — Patient Instructions (Signed)
Steps to Quit Smoking  Smoking tobacco can be harmful to your health and can affect almost every organ in your body. Smoking puts you, and those around you, at risk for developing many serious chronic diseases. Quitting smoking is difficult, but it is one of the best things that you can do for your health. It is never too late to quit. WHAT ARE THE BENEFITS OF QUITTING SMOKING? When you quit smoking, you lower your risk of developing serious diseases and conditions, such as:  Lung cancer or lung disease, such as COPD.  Heart disease.  Stroke.  Heart attack.  Infertility.  Osteoporosis and bone fractures. Additionally, symptoms such as coughing, wheezing, and shortness of breath may get better when you quit. You may also find that you get sick less often because your body is stronger at fighting off colds and infections. If you are pregnant, quitting smoking can help to reduce your chances of having a baby of low birth weight. HOW DO I GET READY TO QUIT? When you decide to quit smoking, create a plan to make sure that you are successful. Before you quit:  Pick a date to quit. Set a date within the next two weeks to give you time to prepare.  Write down the reasons why you are quitting. Keep this list in places where you will see it often, such as on your bathroom mirror or in your car or wallet.  Identify the people, places, things, and activities that make you want to smoke (triggers) and avoid them. Make sure to take these actions:  Throw away all cigarettes at home, at work, and in your car.  Throw away smoking accessories, such as ashtrays and lighters.  Clean your car and make sure to empty the ashtray.  Clean your home, including curtains and carpets.  Tell your family, friends, and coworkers that you are quitting. Support from your loved ones can make quitting easier.  Talk with your health care provider about your options for quitting smoking.  Find out what treatment  options are covered by your health insurance. WHAT STRATEGIES CAN I USE TO QUIT SMOKING?  Talk with your healthcare provider about different strategies to quit smoking. Some strategies include:  Quitting smoking altogether instead of gradually lessening how much you smoke over a period of time. Research shows that quitting "cold turkey" is more successful than gradually quitting.  Attending in-person counseling to help you build problem-solving skills. You are more likely to have success in quitting if you attend several counseling sessions. Even short sessions of 10 minutes can be effective.  Finding resources and support systems that can help you to quit smoking and remain smoke-free after you quit. These resources are most helpful when you use them often. They can include:  Online chats with a counselor.  Telephone quitlines.  Printed self-help materials.  Support groups or group counseling.  Text messaging programs.  Mobile phone applications.  Taking medicines to help you quit smoking. (If you are pregnant or breastfeeding, talk with your health care provider first.) Some medicines contain nicotine and some do not. Both types of medicines help with cravings, but the medicines that include nicotine help to relieve withdrawal symptoms. Your health care provider may recommend:  Nicotine patches, gum, or lozenges.  Nicotine inhalers or sprays.  Non-nicotine medicine that is taken by mouth. Talk with your health care provider about combining strategies, such as taking medicines while you are also receiving in-person counseling. Using these two strategies together makes   you more likely to succeed in quitting than if you used either strategy on its own. If you are pregnant or breastfeeding, talk with your health care provider about finding counseling or other support strategies to quit smoking. Do not take medicine to help you quit smoking unless told to do so by your health care  provider. WHAT THINGS CAN I DO TO MAKE IT EASIER TO QUIT? Quitting smoking might feel overwhelming at first, but there is a lot that you can do to make it easier. Take these important actions:  Reach out to your family and friends and ask that they support and encourage you during this time. Call telephone quitlines, reach out to support groups, or work with a counselor for support.  Ask people who smoke to avoid smoking around you.  Avoid places that trigger you to smoke, such as bars, parties, or smoke-break areas at work.  Spend time around people who do not smoke.  Lessen stress in your life, because stress can be a smoking trigger for some people. To lessen stress, try:  Exercising regularly.  Deep-breathing exercises.  Yoga.  Meditating.  Performing a body scan. This involves closing your eyes, scanning your body from head to toe, and noticing which parts of your body are particularly tense. Purposefully relax the muscles in those areas.  Download or purchase mobile phone or tablet apps (applications) that can help you stick to your quit plan by providing reminders, tips, and encouragement. There are many free apps, such as QuitGuide from the [employer] manager for Disease Control and Prevention). You can find other support for quitting smoking (smoking cessation) through smokefree.gov and other websites. HOW WILL I FEEL WHEN I QUIT SMOKING? Within the first 24 hours of quitting smoking, you may start to feel some withdrawal symptoms. These symptoms are usually most noticeable 2-3 days after quitting, but they usually do not last beyond 2-3 weeks. Changes or symptoms that you might experience include:  Mood swings.  Restlessness, anxiety, or irritation.  Difficulty concentrating.  Dizziness.  Strong cravings for sugary foods in addition to nicotine.  Mild weight gain.  Constipation.  Nausea.  Coughing or a sore throat.  Changes in how your medicines work in your  body.  A depressed mood.  Difficulty sleeping (insomnia). After the first 2-3 weeks of quitting, you may start to notice more positive results, such as:  Improved sense of smell and taste.  Decreased coughing and sore throat.  Slower heart rate.  Lower blood pressure.  Clearer skin.  The ability to breathe more easily.  Fewer sick days. Quitting smoking is very challenging for most people. Do not get discouraged if you are not successful the first time. Some people need to make many attempts to quit before they achieve long-term success. Do your best to stick to your quit plan, and talk with your health care provider if you have any questions or concerns.   This information is not intended to replace advice given to you by your health care provider. Make sure you discuss any questions you have with your health care provider.   Document Released: 02/19/2001 Document Revised: 07/12/2014 Document Reviewed: 07/12/2014 Elsevier Interactive Patient Education 2016 [place] Can Quit Smoking If you are ready to quit smoking or are thinking about it, congratulations! You have chosen to help yourself be healthier and live longer! There are lots of different ways to quit smoking. Nicotine gum, nicotine patches, a nicotine inhaler, or nicotine nasal spray can help with  physical craving. Hypnosis, support groups, and medicines help break the habit of smoking. TIPS TO GET OFF AND STAY OFF CIGARETTES  Learn to predict your moods. Do not let a bad situation be your excuse to have a cigarette. Some situations in your life might tempt you to have a cigarette.  Ask friends and co-workers not to smoke around you.  Make your home smoke-free.  Never have "just one" cigarette. It leads to wanting another and another. Remind yourself of your decision to quit.  On a card, make a list of your reasons for not smoking. Read it at least the same number of times a day as you have a cigarette. Tell  yourself everyday, "I do not want to smoke. I choose not to smoke."  Ask someone at home or work to help you with your plan to quit smoking.  Have something planned after you eat or have a cup of coffee. Take a walk or get other exercise to perk you up. This will help to keep you from overeating.  Try a relaxation exercise to calm you down and decrease your stress. Remember, you may be tense and nervous the first two weeks after you quit. This will pass.  Find new activities to keep your hands busy. Play with a pen, coin, or rubber band. Doodle or draw things on paper.  Brush your teeth right after eating. This will help cut down the craving for the taste of tobacco after meals. You can try mouthwash too.  Try gum, breath mints, or diet candy to keep something in your mouth. IF YOU SMOKE AND WANT TO QUIT:  Do not stock up on cigarettes. Never buy a carton. Wait until one pack is finished before you buy another.  Never carry cigarettes with you at work or at home.  Keep cigarettes as far away from you as possible. Leave them with someone else.  Never carry matches or a lighter with you.  Ask yourself, "Do I need this cigarette or is this just a reflex?"  Bet with someone that you can quit. Put cigarette money in a piggy bank every morning. If you smoke, you give up the money. If you do not smoke, by the end of the week, you keep the money.  Keep trying. It takes 21 days to change a habit!  Talk to your doctor about using medicines to help you quit. These include nicotine replacement gum, lozenges, or skin patches.   This information is not intended to replace advice given to you by your health care provider. Make sure you discuss any questions you have with your health care provider.   Document Released: 12/22/2008 Document Revised: 05/20/2011 Document Reviewed: 12/22/2008 Elsevier Interactive Patient Education 2016 Elsevier Inc[employer]  

## 2015-11-02 ENCOUNTER — Ambulatory Visit (INDEPENDENT_AMBULATORY_CARE_PROVIDER_SITE_OTHER): Payer: Commercial Managed Care - HMO | Admitting: Family Medicine

## 2015-11-02 ENCOUNTER — Encounter: Payer: Self-pay | Admitting: Family Medicine

## 2015-11-02 VITALS — BP 112/77 | HR 94 | Temp 97.7°F | Resp 16 | Ht 69.0 in | Wt 159.0 lb

## 2015-11-02 DIAGNOSIS — E785 Hyperlipidemia, unspecified: Secondary | ICD-10-CM | POA: Diagnosis not present

## 2015-11-02 DIAGNOSIS — R42 Dizziness and giddiness: Secondary | ICD-10-CM | POA: Diagnosis not present

## 2015-11-02 DIAGNOSIS — R5382 Chronic fatigue, unspecified: Secondary | ICD-10-CM | POA: Diagnosis not present

## 2015-11-02 DIAGNOSIS — R634 Abnormal weight loss: Secondary | ICD-10-CM

## 2015-11-02 DIAGNOSIS — R5383 Other fatigue: Secondary | ICD-10-CM | POA: Insufficient documentation

## 2015-11-02 LAB — COMPLETE METABOLIC PANEL WITH GFR
ALBUMIN: 3.9 g/dL (ref 3.6–5.1)
ALK PHOS: 87 U/L (ref 40–115)
ALT: 18 U/L (ref 9–46)
AST: 18 U/L (ref 10–35)
BUN: 20 mg/dL (ref 7–25)
CO2: 24 mmol/L (ref 20–31)
Calcium: 9.4 mg/dL (ref 8.6–10.3)
Chloride: 102 mmol/L (ref 98–110)
Creat: 1.26 mg/dL (ref 0.70–1.33)
GFR, EST NON AFRICAN AMERICAN: 65 mL/min (ref >=60)
GFR, Est African American: 75 mL/min (ref >=60)
GLUCOSE: 81 mg/dL (ref 65–99)
POTASSIUM: 4 mmol/L (ref 3.5–5.3)
SODIUM: 138 mmol/L (ref 135–146)
Total Bilirubin: 0.5 mg/dL (ref 0.2–1.2)
Total Protein: 6.9 g/dL (ref 6.1–8.1)

## 2015-11-02 LAB — CBC WITH DIFFERENTIAL/PLATELET
BASOS ABS: 0 {cells}/uL (ref 0–200)
Basophils Relative: 0 %
EOS PCT: 2 %
Eosinophils Absolute: 148 cells/uL (ref 15–500)
HCT: 43.2 % (ref 38.5–50.0)
HEMOGLOBIN: 14.7 g/dL (ref 13.2–17.1)
LYMPHS ABS: 2738 {cells}/uL (ref 850–3900)
LYMPHS PCT: 37 %
MCH: 31.2 pg (ref 27.0–33.0)
MCHC: 34 g/dL (ref 32.0–36.0)
MCV: 91.7 fL (ref 80.0–100.0)
MONOS PCT: 10 %
MPV: 9.2 fL (ref 7.5–12.5)
Monocytes Absolute: 740 cells/uL (ref 200–950)
NEUTROS ABS: 3774 {cells}/uL (ref 1500–7800)
NEUTROS PCT: 51 %
PLATELETS: 369 10*3/uL (ref 140–400)
RBC: 4.71 MIL/uL (ref 4.20–5.80)
RDW: 13.1 % (ref 11.0–15.0)
WBC: 7.4 10*3/uL (ref 3.8–10.8)

## 2015-11-02 LAB — LIPID PANEL
Cholesterol: 191 mg/dL (ref 125–200)
HDL: 46 mg/dL
LDL Cholesterol: 121 mg/dL
Total CHOL/HDL Ratio: 4.2 ratio
Triglycerides: 122 mg/dL
VLDL: 24 mg/dL

## 2015-11-02 MED ORDER — MECLIZINE HCL 25 MG PO TABS: 25.0000 mg | ORAL_TABLET | Freq: Three times a day (TID) | ORAL | 1 refills | Status: DC | PRN

## 2015-11-02 NOTE — Progress Notes (Signed)
Name: Craig Nolan   MRN: [id number]    DOB: 02/23/1966   Date:11/02/2015       Progress Note  Subjective  Chief Complaint  Chief Complaint  Patient presents with  . Dizziness    HPI Dizziness on and off for 2 weeks.  Not eating well and not taking any of his meds. for 1-2 months  He is not sleeping well.  He has not followed advice of his Psych.  He has lost 40# over past 4 months.  Not use Cocaine now.  Smokes grass occ.  Only rare alcohol.  He gets nauseated whenever he eats and stops  Dizziness worse with getting out of bed.  Gets tingling in both arms on and off.  Poor energy.  Also c/o feeling very tired.  No problem-specific Assessment & Plan notes found for this encounter.   Past Medical History:  Diagnosis Date  . Drug abuse   . Dyslipidemia   . Hemorrhoids   . Hyperlipidemia   . Hypertension   . PTSD (post-traumatic stress disorder)   . Seasonal allergies   . Tobacco use     Social History  Substance Use Topics  . Smoking status: Current Every Day Smoker    Packs/day: 2.00    Years: 20.00    Types: Cigarettes  . Smokeless tobacco: Never Used  . Alcohol use No     Comment: stopped 5 years     Current Outpatient Prescriptions:  .  atorvastatin (LIPITOR) 20 MG tablet, Take 1 tablet (20 mg total) by mouth at bedtime. (Patient not taking: Reported on 11/02/2015), Disp: 90 tablet, Rfl: 3 .  buPROPion (WELLBUTRIN XL) 300 MG 24 hr tablet, Take 300 mg by mouth daily., Disp: , Rfl:  .  cloNIDine (CATAPRES) 0.2 MG tablet, Take 0.2 mg by mouth 2 (two) times daily as needed., Disp: , Rfl:  .  diazepam (VALIUM) 5 MG tablet, Take 1 tablet (5 mg total) by mouth every 8 (eight) hours as needed for anxiety. (Patient not taking: Reported on 11/02/2015), Disp: 3 tablet, Rfl: 0 .  docusate sodium (COLACE) 100 MG capsule, Take 2 capsules (200 mg total) by mouth at bedtime as needed for moderate constipation. Do not use longer than 7 days. (Patient not taking: Reported on  11/02/2015), Disp: 60 capsule, Rfl: PRN .  docusate sodium (STOOL SOFTENER) 100 MG capsule, Take 100 mg by mouth 2 (two) times daily., Disp: , Rfl:  .  EPITOL 200 MG tablet, Take 200 mg by mouth daily., Disp: , Rfl:  .  gabapentin (NEURONTIN) 300 MG capsule, Take 1,200 mg by mouth daily., Disp: , Rfl:  .  Magnesium Oxide 500 MG CAPS, Take 1 capsule (500 mg total) by mouth daily. (Patient not taking: Reported on 11/02/2015), Disp: 100 capsule, Rfl: PRN .  Melatonin 10 MG CAPS, Take 20 mg by mouth at bedtime as needed. (Patient not taking: Reported on 11/02/2015), Disp: 60 capsule, Rfl: 2 .  Multiple Vitamins-Minerals (CENTRUM SILVER ADULT 50+ PO), Take 1 tablet by mouth daily., Disp: , Rfl:  .  omeprazole (PRILOSEC) 20 MG capsule, Take 20 mg by mouth daily., Disp: , Rfl:  .  QUEtiapine (SEROQUEL) 50 MG tablet, Take 50 mg by mouth 2 (two) times daily., Disp: , Rfl:  .  vitamin C (ASCORBIC ACID) 500 MG tablet, Take 500 mg by mouth daily., Disp: , Rfl:  .  Wheat Dextrin (BENEFIBER) POWD, Stir 2 tsp. TID into 4-8 oz of any non-carbonated beverage or soft  food (hot or cold) (Patient not taking: Reported on 11/02/2015), Disp: 500 g, Rfl: PRN  Not on File  Review of Systems  Constitutional: Positive for malaise/fatigue and weight loss. Negative for chills and fever.  HENT: Negative for hearing loss.   Eyes: Negative for blurred vision and double vision.  Respiratory: Negative for cough, shortness of breath and wheezing.   Cardiovascular: Negative for chest pain, palpitations and leg swelling.  Gastrointestinal: Negative for abdominal pain, blood in stool and heartburn.  Genitourinary: Negative for dysuria, frequency and urgency.  Skin: Negative for rash.  Neurological: Positive for dizziness, tingling and weakness. Negative for tremors and headaches.  Psychiatric/Behavioral: Positive for depression. The patient is nervous/anxious and has insomnia.       Objective  Vitals:   11/02/15 1008  BP:  112/77  Pulse: 94  Resp: 16  Temp: 97.7 F (36.5 C)  TempSrc: Oral  Weight: 159 lb (72.1 kg)  Height: 5\' 9"  (1.753 m)     Physical Exam  Constitutional: He is oriented to person, place, and time. No distress.  HENT:  Head: Normocephalic and atraumatic.  Eyes: Conjunctivae and EOM are normal. Pupils are equal, round, and reactive to light. No scleral icterus.  Neck: Normal range of motion. Neck supple. Carotid bruit is not present. No thyromegaly present.  Cardiovascular: Normal rate, regular rhythm and normal heart sounds.  Exam reveals no gallop and no friction rub.   No murmur heard. Pulmonary/Chest: Effort normal. No respiratory distress. He has no wheezes. He has no rales.  Diffuse musical rhonchi throughout that clear with cough  Abdominal: He exhibits no distension and no mass. There is tenderness (difuse mild tenderness).  Musculoskeletal: He exhibits no edema.  Lymphadenopathy:    He has no cervical adenopathy.  Neurological: He is alert and oriented to person, place, and time.  Psychiatric:  Agitated state with excessive talking.  Vitals reviewed.     No results found for this or any previous visit (from the past 2160 hour(s)).   Assessment & Plan  1. Chronic fatigue  - COMPLETE METABOLIC PANEL WITH GFR - CBC with Differential - TSH  2. Dizziness  - meclizine (ANTIVERT) 25 MG tablet; Take 1 tablet (25 mg total) by mouth 3 (three) times daily as needed for dizziness.  Dispense: 30 tablet; Refill: 1  3. Hyperlipidemia  - Lipid Profile  4. Weight loss  - Ambulatory referral to Gastroenterology

## 2015-11-03 LAB — TSH: TSH: 0.52 m[IU]/L (ref 0.40–4.50)

## 2015-11-06 ENCOUNTER — Encounter: Payer: Self-pay | Admitting: *Deleted

## 2015-12-07 ENCOUNTER — Telehealth: Payer: Self-pay | Admitting: Family Medicine

## 2015-12-07 ENCOUNTER — Ambulatory Visit: Payer: Commercial Managed Care - HMO | Admitting: Family Medicine

## 2015-12-07 NOTE — Telephone Encounter (Signed)
Ok.  Reschedule if he cont to lose weight over nest 1-2 months.-jh

## 2015-12-07 NOTE — Telephone Encounter (Signed)
Pt called to cancel appt today.  He said he was sleeping better, eating good but he did lose about 5 more pounds.  His call back number is [phone]

## 2016-01-09 DIAGNOSIS — K219 Gastro-esophageal reflux disease without esophagitis: Secondary | ICD-10-CM | POA: Diagnosis not present

## 2016-01-09 DIAGNOSIS — R634 Abnormal weight loss: Secondary | ICD-10-CM | POA: Diagnosis not present

## 2016-01-09 DIAGNOSIS — Z800 Family history of malignant neoplasm of digestive organs: Secondary | ICD-10-CM | POA: Diagnosis not present

## 2016-04-26 ENCOUNTER — Encounter: Payer: Self-pay | Admitting: *Deleted

## 2016-04-29 ENCOUNTER — Encounter: Admission: RE | Payer: Self-pay | Source: Ambulatory Visit

## 2016-04-29 ENCOUNTER — Ambulatory Visit: Admission: RE | Admit: 2016-04-29 | Payer: Medicare HMO | Source: Ambulatory Visit | Admitting: Gastroenterology

## 2016-04-29 HISTORY — DX: Bipolar disorder, unspecified: F31.9

## 2016-04-29 HISTORY — DX: Sleep apnea, unspecified: G47.30

## 2016-04-29 SURGERY — COLONOSCOPY WITH PROPOFOL
Anesthesia: General

## 2016-05-17 ENCOUNTER — Ambulatory Visit (INDEPENDENT_AMBULATORY_CARE_PROVIDER_SITE_OTHER): Payer: Commercial Managed Care - HMO | Admitting: Physician Assistant

## 2016-05-17 ENCOUNTER — Encounter: Payer: Self-pay | Admitting: Physician Assistant

## 2016-05-17 VITALS — BP 136/83 | HR 71 | Temp 98.4°F | Resp 16 | Ht 69.0 in | Wt 175.0 lb

## 2016-05-17 DIAGNOSIS — R52 Pain, unspecified: Secondary | ICD-10-CM

## 2016-05-17 DIAGNOSIS — J069 Acute upper respiratory infection, unspecified: Secondary | ICD-10-CM | POA: Diagnosis not present

## 2016-05-17 DIAGNOSIS — B9789 Other viral agents as the cause of diseases classified elsewhere: Secondary | ICD-10-CM | POA: Diagnosis not present

## 2016-05-17 DIAGNOSIS — R5383 Other fatigue: Secondary | ICD-10-CM

## 2016-05-17 LAB — POCT INFLUENZA A/B
Influenza A, POC: NEGATIVE
Influenza B, POC: NEGATIVE

## 2016-05-17 MED ORDER — BENZONATATE 100 MG PO CAPS: 100.0000 mg | ORAL_CAPSULE | Freq: Three times a day (TID) | ORAL | 0 refills | Status: AC | PRN

## 2016-05-17 NOTE — Patient Instructions (Signed)
May gargle with liquid benadryl, may spit this out.   Upper Respiratory Infection, Adult Most upper respiratory infections (URIs) are caused by a virus. A URI affects the nose, throat, and upper air passages. The most common type of URI is often called "the common cold." Follow these instructions at home:  Take medicines only as told by your doctor.  Gargle warm saltwater or take cough drops to comfort your throat as told by your doctor.  Use a warm mist humidifier or inhale steam from a shower to increase air moisture. This may make it easier to breathe.  Drink enough fluid to keep your pee (urine) clear or pale yellow.  Eat soups and other clear broths.  Have a healthy diet.  Rest as needed.  Go back to work when your fever is gone or your doctor says it is okay.  You may need to stay home longer to avoid giving your URI to others.  You can also wear a face mask and wash your hands often to prevent spread of the virus.  Use your inhaler more if you have asthma.  Do not use any tobacco products, including cigarettes, chewing tobacco, or electronic cigarettes. If you need help quitting, ask your doctor. Contact a doctor if:  You are getting worse, not better.  Your symptoms are not helped by medicine.  You have chills.  You are getting more short of breath.  You have brown or red mucus.  You have yellow or brown discharge from your nose.  You have pain in your face, especially when you bend forward.  You have a fever.  You have puffy (swollen) neck glands.  You have pain while swallowing.  You have white areas in the back of your throat. Get help right away if:  You have very bad or constant:  Headache.  Ear pain.  Pain in your forehead, behind your eyes, and over your cheekbones (sinus pain).  Chest pain.  You have long-lasting (chronic) lung disease and any of the following:  Wheezing.  Long-lasting cough.  Coughing up blood.  A change in  your usual mucus.  You have a stiff neck.  You have changes in your:  Vision.  Hearing.  Thinking.  Mood. This information is not intended to replace advice given to you by your health care provider. Make sure you discuss any questions you have with your health care provider. Document Released: 08/14/2007 Document Revised: 10/29/2015 Document Reviewed: 06/02/2013 Elsevier Interactive Patient Education  2017 [employer].

## 2016-05-17 NOTE — Progress Notes (Signed)
Subjective:    Patient ID: Craig Nolan, male    DOB: 09/15/1965, 51 y.o.   MRN: [id number]  Craig Nolan is a 51 y.o. male presenting on 05/17/2016 for Influenza (bodyaches, cough, st)   HPI   Patient is a 51 y/o man with history of 2 pack per day smoking, bipolar disorder and substance abuse presenting today for possible flu. He endorses body aches, fatigue, and cough x 3-4 days. He has not had fevers or chills. His mother is sick with similar symptoms. He did not have his flu shot this season. No nausea, vomiting. Has been taking Theraflu and Nyquil.   Social History  Substance Use Topics  . Smoking status: Current Every Day Smoker    Packs/day: 2.00    Years: 20.00    Types: Cigarettes  . Smokeless tobacco: Never Used  . Alcohol use No     Comment: stopped 5 years    Review of Systems Per HPI unless specifically indicated above     Objective:    BP 136/83 (BP Location: Right Arm, Patient Position: Sitting, Cuff Size: Large)   Pulse 71   Temp 98.4 F (36.9 C) (Oral)   Resp 16   Ht 5\' 9"  (1.753 m)   Wt 175 lb (79.4 kg)   BMI 25.84 kg/m   Wt Readings from Last 3 Encounters:  05/17/16 175 lb (79.4 kg)  11/02/15 159 lb (72.1 kg)  07/17/15 198 lb (89.8 kg)    Physical Exam  Constitutional: He is oriented to person, place, and time. He appears well-developed and well-nourished. He is cooperative. He does not appear ill. No distress.  HENT:  Right Ear: Tympanic membrane and external ear normal.  Left Ear: Tympanic membrane and external ear normal.  Nose: Right sinus exhibits no maxillary sinus tenderness and no frontal sinus tenderness. Left sinus exhibits no maxillary sinus tenderness and no frontal sinus tenderness.  Mouth/Throat: Oropharynx is clear and moist and mucous membranes are normal. No oropharyngeal exudate, posterior oropharyngeal edema, posterior oropharyngeal erythema or tonsillar abscesses.  Eyes: Conjunctivae are normal.  Neck: Normal range of  motion.  Cardiovascular: Normal rate and regular rhythm.   Pulmonary/Chest: Effort normal. He has wheezes.  Few LUF expiratory wheezes  Lymphadenopathy:    He has cervical adenopathy.  Neurological: He is oriented to person, place, and time.  Skin: Skin is warm and dry. He is not diaphoretic.  Psychiatric: He has a normal mood and affect. His behavior is normal.   Results for orders placed or performed in visit on 05/17/16  POCT Influenza A/B  Result Value Ref Range   Influenza A, POC Negative Negative   Influenza B, POC Negative Negative      Assessment & Plan:   1. Viral URI with cough  Patient presenting with URI, negative rapid flu. Patient with symptoms x 3-4 days so do think empiric Tamiful would be of little benefit. Patient seems somewhat somnolent in office today. Advised to stop taking Nyquil and Theraflu, 2/2 sedation and HTN. Advised he may treat sx by gargling with liquid Benadryl and some pain relief for muscle aches.  - benzonatate (TESSALON PERLES) 100 MG capsule; Take 1 capsule (100 mg total) by mouth 3 (three) times daily as needed for cough.  Dispense: 21 capsule; Refill: 0  2. Body aches   - POCT Influenza A/B  3. Fatigue, unspecified type   - POCT Influenza A/B  Meds ordered this encounter  Medications  . benzonatate (TESSALON PERLES)  100 MG capsule    Sig: Take 1 capsule (100 mg total) by mouth 3 (three) times daily as needed for cough.    Dispense:  21 capsule    Refill:  0    Order Specific Question:   Supervising Provider    Answer:   Craig Nolan [[id number]]     Follow up plan: Return if symptoms worsen or fail to improve.   Craig Collet, PA-C [facility] 05/17/2016, 10:49 AM

## 2016-10-14 ENCOUNTER — Ambulatory Visit (INDEPENDENT_AMBULATORY_CARE_PROVIDER_SITE_OTHER): Payer: Medicare HMO | Admitting: Family Medicine

## 2016-10-14 ENCOUNTER — Encounter: Payer: Self-pay | Admitting: Family Medicine

## 2016-10-14 DIAGNOSIS — F3111 Bipolar disorder, current episode manic without psychotic features, mild: Secondary | ICD-10-CM | POA: Diagnosis not present

## 2016-10-14 DIAGNOSIS — N529 Male erectile dysfunction, unspecified: Secondary | ICD-10-CM | POA: Insufficient documentation

## 2016-10-14 MED ORDER — SILDENAFIL CITRATE 20 MG PO TABS: ORAL_TABLET | ORAL | 2 refills | Status: DC

## 2016-10-14 NOTE — Progress Notes (Signed)
Subjective:    Patient ID: Craig Nolan, male    DOB: [birth year], 51 y.o.   MRN: [id number]  Craig Nolan is a 51 y.o. male presenting on 10/14/2016 for Medication Problem (sildenafil not helping)   HPI   Erectil Dysfunction: - Problem now for >2 years. He describes difficulty obtaining erection and especially describe instances when he is with a partner and is unable to get an erection causing emotional distress. He was prescribed Sildenafil 20mg  generic in past by his psychiatrist, he tried up to 4 pills per dose, lower doses did not help, also the 4 pills did not really help, almost out of med today, and now he is very frustrating with no relief. - He has rare random erections, now he no longer wakes up with erections - He denies any contributing factor with psychiatry illness vs mental health contributing. He has sexual desire, states he does not think it was an attraction problem - He does admit to some chronic back pain, taking Ibuprofen/Tylenol regularly PRN - No longer taking any Bipolar meds see below - Active smoker - Denies any urinary problems, dysuria, pain, decreased urine output  History of Bipolar - Previously followed by Dr Bernita Raisin (Psychiatrist, [facility]), last visit 4 months ago. Now patient has self discontinued all psych meds, states that they were "messing up my insides" and causing pain and other problems and side effects, he could not tolerate them, he has tried variety of options in past, most recently he states that he was switched to Valium PRN 1-2 times daily and this seems to help control anxiety. He does not plan to go back to Dr Rosine Door, and is frustrated with this. He does not want any more medications.  Social History  Substance Use Topics  . Smoking status: Current Every Day Smoker    Packs/day: 2.00    Years: 20.00    Types: Cigarettes  . Smokeless tobacco: Never Used  . Alcohol use No     Comment: stopped 5 years    Review of  Systems Per HPI unless specifically indicated above     Objective:    BP (!) 117/99   Pulse 82   Temp 97.6 F (36.4 C) (Oral)   Resp 16   Ht 5\' 9"  (1.753 m)   Wt 163 lb (73.9 kg)   BMI 24.07 kg/m   Wt Readings from Last 3 Encounters:  10/14/16 163 lb (73.9 kg)  05/17/16 175 lb (79.4 kg)  11/02/15 159 lb (72.1 kg)    Physical Exam  Constitutional: He is oriented to person, place, and time. He appears well-developed and well-nourished. No distress.  HENT:  Head: Normocephalic and atraumatic.  Mouth/Throat: Oropharynx is clear and moist.  Eyes: Conjunctivae are normal. Right eye exhibits no discharge. Left eye exhibits no discharge.  Cardiovascular: Normal rate and intact distal pulses.   Pulmonary/Chest: Effort normal.  Musculoskeletal: He exhibits no edema.  Neurological: He is alert and oriented to person, place, and time.  Skin: Skin is warm and dry. No rash noted. He is not diaphoretic. No erythema.  Psychiatric: He has a normal mood and affect.  Well groomed, good eye contact, speech is pressured and animated at times, thoughts are normal but he exhibits some agitation and anxiety during visit  Nursing note and vitals reviewed.      Assessment & Plan:   Problem List Items Addressed This Visit    Erectile dysfunction    Consistent with likely age-related multifactorial  ED also with mental health problems dipolar/anxiety, also thought secondary to medications but now months off meds. Could be with chronic pain too. Vascular risk factors with chronic smoker, some elevated BP. - Failed Sildenafil 20mg  up to 1-4 tabs per dose  Plan: 1. Trial on generic Sildenafil 20mg  tabs, up to max 5 tabs = dose 100mg  about 30 min prior to sexual activity, refills provided, printed for [facility] - Discussion on other options, other PDE5 inhibitors expensive, not covered, patient very frustrated with this 2. Follow-up as needed if not improving discussed referral to  Urology for second opinion      Relevant Medications   sildenafil (REVATIO) 20 MG tablet   Bipolar disorder (HCC) (Chronic)    Uncontrolled chronic problem No longer adhering to Psych meds Quetiapine (Seroquel) 50mg  BID, Gabapentin 1200mg  daily, Wellbutrin XL 300mg  daily, off for months - Admits taking Valium 1-2 times daily for anxiety with better results, checked [place] CSRS with no data available for last valium rx or any controlled rx  Plan: 1. Expressed concern for patient that I recommended at least follow back up with Psychiatry to discuss medications and options and figure out if changes can be made, if he does not like previous meds. Since his symptoms seem uncontrolled needs further assistance. He is not asking me for medications, I explained should be seen by same or different psychiatry if needed         Meds ordered this encounter  Medications  . DISCONTD: sildenafil (REVATIO) 20 MG tablet    Sig: Take 4-5 pills about 30 min prior to sex    Dispense:  50 tablet    Refill:  2  . sildenafil (REVATIO) 20 MG tablet    Sig: Take 4-5 pills about 30 min prior to sex    Dispense:  50 tablet    Refill:  2      Follow up plan: Return in about 6 weeks (around 11/25/2016) for Erectile Dysfunction.  Craig Nolan, [facility] 10/14/2016, 10:18 AM

## 2016-10-14 NOTE — Patient Instructions (Addendum)
Thank you for coming to the clinic today.  1. Try taking up to 5 pills of the 20mg  Sildenafil for dose of 100mg  per dose (ONLY ONE dose in 24 hours), take about 30 min before attempted sexual intercourse.  [facility]   Address: [address] [place], [place] [address] Hours: 8:30AM-6:30PM Phone: [phone]  If not improved, then can refer you to Urology for second opinion  [facility] -[address]st floor [place]  [address] Phone: [phone]  I would recommend that you follow back with Dr Rosine Door or other Psychiatry.  Please schedule a Follow-up Appointment to: Return in about 6 weeks (around 11/25/2016) for Erectile Dysfunction.  If you have any other questions or concerns, please feel free to call the clinic or send a message through [place]. You may also schedule an earlier appointment if necessary.  Additionally, you may be receiving a survey about your experience at our clinic within a few days to 1 week by e-mail or mail. We value your feedback.  Nobie Putnam, DO [place]

## 2016-10-14 NOTE — Assessment & Plan Note (Signed)
Consistent with likely age-related multifactorial ED also with mental health problems dipolar/anxiety, also thought secondary to medications but now months off meds. Could be with chronic pain too. Vascular risk factors with chronic smoker, some elevated BP. - Failed Sildenafil 20mg  up to 1-4 tabs per dose  Plan: 1. Trial on generic Sildenafil 20mg  tabs, up to max 5 tabs = dose 100mg  about 30 min prior to sexual activity, refills provided, printed for [facility] - Discussion on other options, other PDE5 inhibitors expensive, not covered, patient very frustrated with this 2. Follow-up as needed if not improving discussed referral to Urology for second opinion

## 2016-10-14 NOTE — Assessment & Plan Note (Signed)
Uncontrolled chronic problem No longer adhering to Psych meds Quetiapine (Seroquel) 50mg  BID, Gabapentin 1200mg  daily, Wellbutrin XL 300mg  daily, off for months - Admits taking Valium 1-2 times daily for anxiety with better results, checked [place] CSRS with no data available for last valium rx or any controlled rx  Plan: 1. Expressed concern for patient that I recommended at least follow back up with Psychiatry to discuss medications and options and figure out if changes can be made, if he does not like previous meds. Since his symptoms seem uncontrolled needs further assistance. He is not asking me for medications, I explained should be seen by same or different psychiatry if needed

## 2016-11-14 DIAGNOSIS — M898X9 Other specified disorders of bone, unspecified site: Secondary | ICD-10-CM | POA: Diagnosis not present

## 2016-11-14 DIAGNOSIS — M2042 Other hammer toe(s) (acquired), left foot: Secondary | ICD-10-CM | POA: Diagnosis not present

## 2016-11-15 ENCOUNTER — Other Ambulatory Visit: Payer: Self-pay | Admitting: Family Medicine

## 2016-11-15 ENCOUNTER — Ambulatory Visit (INDEPENDENT_AMBULATORY_CARE_PROVIDER_SITE_OTHER): Payer: Medicare HMO | Admitting: Family Medicine

## 2016-11-15 ENCOUNTER — Encounter: Payer: Self-pay | Admitting: Family Medicine

## 2016-11-15 VITALS — BP 116/81 | HR 75 | Resp 16 | Ht 69.0 in | Wt 159.6 lb

## 2016-11-15 DIAGNOSIS — Z01818 Encounter for other preprocedural examination: Secondary | ICD-10-CM

## 2016-11-15 DIAGNOSIS — Z720 Tobacco use: Secondary | ICD-10-CM

## 2016-11-15 DIAGNOSIS — G894 Chronic pain syndrome: Secondary | ICD-10-CM | POA: Diagnosis not present

## 2016-11-15 DIAGNOSIS — F3111 Bipolar disorder, current episode manic without psychotic features, mild: Secondary | ICD-10-CM

## 2016-11-15 DIAGNOSIS — R7989 Other specified abnormal findings of blood chemistry: Secondary | ICD-10-CM

## 2016-11-15 DIAGNOSIS — E782 Mixed hyperlipidemia: Secondary | ICD-10-CM

## 2016-11-15 DIAGNOSIS — F5105 Insomnia due to other mental disorder: Secondary | ICD-10-CM

## 2016-11-15 DIAGNOSIS — F99 Mental disorder, not otherwise specified: Principal | ICD-10-CM

## 2016-11-15 DIAGNOSIS — R799 Abnormal finding of blood chemistry, unspecified: Secondary | ICD-10-CM

## 2016-11-15 DIAGNOSIS — M2042 Other hammer toe(s) (acquired), left foot: Secondary | ICD-10-CM | POA: Diagnosis not present

## 2016-11-15 MED ORDER — BACLOFEN 10 MG PO TABS: 5.0000 mg | ORAL_TABLET | Freq: Three times a day (TID) | ORAL | 0 refills | Status: DC | PRN

## 2016-11-15 NOTE — Progress Notes (Signed)
Subjective:    Patient ID: Craig Nolan, male    DOB: 27-Aug-1965, 51 y.o.   MRN: [id number]  ZAVIER CANELA is a 51 y.o. male presenting on 11/15/2016 for Toe Pain (need medical surgery clearance)   HPI   Medical Pre-Op Clearance / Left Foot Surgery (4-5th digit hammer toe) - Last seen by Dr Cleda Mccreedy [facility]) on 11/14/16 for initial eval of Left Foot Hammer Toes, 4th-5th digits, he had X-ray done at that time that confirmed finding. Patient experiencing significant pain and some numbness affecting these toes limiting his function and ambulation. He had similar surgery done in past on R foot. - He is here for medical clearance - He reports that he does not have any known history of cardiovascular disease. No prior MI or CVA. - He is able to tolerate regular exercise up to >4 METs including walking up 2 flights of stairs without dyspnea, or other mild to moderate exertion without exertional symptoms - No known history of problems with anesthesia. He has had several surgeries in past including R foot surgery by Dr Cleda Mccreedy, Back Surgery, Cholecystectomy, Knee Surgery - PMH: Active smoker (now reduced to < 1ppd, presumed COPD without formal lung testing, history of psychiatric illness with bipolar and depression and insomnia but off of medications now, other chronic pain syndrome with osteoarthritis and back pain)  Health maintenance: - Declines Flu Shot  Past Surgical History:  Procedure Laterality Date  . BACK SURGERY    . CHOLECYSTECTOMY    . COLONOSCOPY    . CYSTECTOMY    . KNEE SURGERY      Past Medical History:  Diagnosis Date  . Bipolar disorder (Why)   . Drug abuse   . Dyslipidemia   . Hemorrhoids   . Hyperlipidemia   . Hypertension   . PTSD (post-traumatic stress disorder)   . Seasonal allergies   . Sleep apnea   . Tobacco use    Family History  Problem Relation Age of Onset  . Diabetes Mother   . Cancer Father      Social History  Substance Use Topics  .  Smoking status: Current Every Day Smoker    Packs/day: 2.00    Years: 20.00    Types: Cigarettes  . Smokeless tobacco: Never Used  . Alcohol use No     Comment: stopped 5 years    Review of Systems  Constitutional: Negative for activity change, appetite change, chills, diaphoresis, fatigue, fever and unexpected weight change.       Tired today, poor sleep  HENT: Negative for congestion, hearing loss and sinus pressure.   Eyes: Negative for visual disturbance.  Respiratory: Positive for cough (chronic, smoking). Negative for apnea, chest tightness, shortness of breath and wheezing.   Cardiovascular: Negative for chest pain, palpitations and leg swelling.  Gastrointestinal: Negative for abdominal pain, anal bleeding, blood in stool, constipation, diarrhea, nausea and vomiting.  Endocrine: Negative for cold intolerance and polyuria.  Genitourinary: Negative for decreased urine volume, difficulty urinating, dysuria, frequency and hematuria.  Musculoskeletal: Positive for arthralgias (Left foot). Negative for neck pain.  Skin: Negative for rash.  Allergic/Immunologic: Negative for environmental allergies.  Neurological: Positive for numbness (Left foot). Negative for dizziness, weakness, light-headedness and headaches.  Hematological: Negative for adenopathy.  Psychiatric/Behavioral: Positive for sleep disturbance. Negative for behavioral problems, dysphoric mood, self-injury and suicidal ideas. The patient is not nervous/anxious.    Per HPI unless specifically indicated above     Objective:  BP 116/81   Pulse 75   Resp 16   Ht 5\' 9"  (1.753 m)   Wt 159 lb 9.6 oz (72.4 kg)   BMI 23.57 kg/m   Wt Readings from Last 3 Encounters:  11/15/16 159 lb 9.6 oz (72.4 kg)  10/14/16 163 lb (73.9 kg)  05/17/16 175 lb (79.4 kg)    Physical Exam  Constitutional: He is oriented to person, place, and time. He appears well-developed and well-nourished. No distress.  Well-appearing, comfortable,  cooperative  HENT:  Head: Normocephalic and atraumatic.  Mouth/Throat: Oropharynx is clear and moist.  Eyes: Conjunctivae are normal. Right eye exhibits no discharge. Left eye exhibits no discharge.  Neck: Normal range of motion. Neck supple. No thyromegaly present.  Cardiovascular: Normal rate, regular rhythm, normal heart sounds and intact distal pulses.   No murmur heard. Pulmonary/Chest: Effort normal. No respiratory distress. He has no wheezes. He has no rales.  Mild scattered exp wheezes, cleared  Musculoskeletal: Normal range of motion. He exhibits no edema.  Left Foot with 4-5th digit hammer toe deformity, 5th toe with some mild erythema, callus formation, limited mobility due to pain. Reduced sensation to touch. Intact pulse.  Lymphadenopathy:    He has no cervical adenopathy.  Neurological: He is alert and oriented to person, place, and time.  Skin: Skin is warm and dry. Rash (Mild punctate arthropod bites across left anterior shin, no extending erythema, no abscess, no drainage, healing scabs) noted. He is not diaphoretic. No erythema.  Psychiatric: He has a normal mood and affect. His behavior is normal.  Good eye contact, normal speech and thoughts, does seem drowsy today with poor sleep  Nursing note and vitals reviewed.   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  Pre-op  clearance for non-cardiac surgery today, Left Foot (4th-5th toe hammer toe/exostoss) (Low risk), presumed general anesthesia. - Previously tolerated multiple surgeries with general anesthesia - No known cardiac hx. Appropriate functional status >4 METs - Active smoker (increased risk)  Plan: 1. Cleared for elective surgery. Completed provided pre-op paperwork per [facility] office 2. Will check BMET for Cr monitoring - last lab 10/2015 with mild elevated Cr, will return next week per patient preference for lab - NOTE if abnormal creatinine, may hold pre-op clearance and will notify Dr Cleda Mccreedy and patient 3. Repeat EKG today. Last done 2015. Reviewed, unchanged. No acute finding. 4. Concern with off psych meds still, however he has been advised to follow-up with Psychiatry in future. 5. Rx Baclofen PRN pain, STOP aspirin. May take Tylenol / Ibuprofen PRN  Revised Cardiac Risk Index (RCRI) Rate of cardiac death, nonfatal MI, and nonfatal cardiac arrest =  No risk factors - 0.4 percent (95% CI: 0.1-0.8)  Follow up plan: Return in about 1 week (around 11/22/2016) for  Lab Only.  Nobie Putnam, [facility] 11/15/2016, 8:48 AM

## 2016-11-15 NOTE — Patient Instructions (Addendum)
Thank you for coming to the clinic today.  1. You are cleared for surgery. We will check blood work next week, if there is a significant problem at that time we will let you and Dr Cleda Mccreedy know.  Please STOP taking Aspirin.  Recommend to start taking Tylenol Extra Strength 500mg  tabs - take 1 to 2 tabs per dose (max 1000mg ) every 6-8 hours for pain (take regularly, don't skip a dose for next 7 days), max 24 hour daily dose is 6 tablets or 3000mg . In the future you can repeat the same everyday Tylenol course for 1-2 weeks at a time.   Recommend may still take anti-inflammatory with Ibuprofen 200mg  tabs - take ONE to THREE per dose with food and plenty of water UP TO THREE TIMES daily every day ONLY AS NEEDED - DO NOT TAKE any aleve, motrin while you are taking this medicine  Start taking Baclofen (Lioresal) 10mg  (muscle relaxant) - start with half (cut) to one whole pill at night as needed for next 1-3 nights (may make you drowsy, caution with driving) see how it affects you, then if tolerated increase to one pill 2 to 3 times a day or (every 8 hours as needed)  DUE for FASTING BLOOD WORK (no food or drink after midnight before the lab appointment, only water or coffee without cream/sugar on the morning of)  SCHEDULE "Lab Only" visit in the morning at the clinic for lab draw in 1 week  - Make sure Lab Only appointment is at about 1 week before your next appointment, so that results will be available  For Lab Results, once available within 2-3 days of blood draw, you can can log in to MyChart online to view your results and a brief explanation. Also, we can discuss results at next follow-up visit.  Please schedule a Follow-up Appointment to: Return in about 1 week (around 11/22/2016) for Lab Only.  If you have any other questions or concerns, please feel free to call the clinic or send a message through [place]. You may also schedule an earlier appointment if necessary.  Additionally, you may be  receiving a survey about your experience at our clinic within a few days to 1 week by e-mail or mail. We value your feedback.  Nobie Putnam, DO [place]

## 2016-11-19 ENCOUNTER — Other Ambulatory Visit: Payer: Medicare HMO

## 2016-11-19 ENCOUNTER — Other Ambulatory Visit: Payer: Self-pay | Admitting: Podiatry

## 2016-11-19 DIAGNOSIS — F5105 Insomnia due to other mental disorder: Secondary | ICD-10-CM

## 2016-11-19 DIAGNOSIS — Z01818 Encounter for other preprocedural examination: Secondary | ICD-10-CM

## 2016-11-19 DIAGNOSIS — E782 Mixed hyperlipidemia: Secondary | ICD-10-CM | POA: Diagnosis not present

## 2016-11-19 DIAGNOSIS — F99 Mental disorder, not otherwise specified: Principal | ICD-10-CM

## 2016-11-19 DIAGNOSIS — R7989 Other specified abnormal findings of blood chemistry: Secondary | ICD-10-CM | POA: Diagnosis not present

## 2016-11-19 DIAGNOSIS — R799 Abnormal finding of blood chemistry, unspecified: Secondary | ICD-10-CM

## 2016-11-20 LAB — COMPLETE METABOLIC PANEL WITH GFR
AG Ratio: 1.2 (calc) (ref 1.0–2.5)
ALT: 8 U/L — AB (ref 9–46)
AST: 16 U/L (ref 10–35)
Albumin: 3.4 g/dL — ABNORMAL LOW (ref 3.6–5.1)
Alkaline phosphatase (APISO): 89 U/L (ref 40–115)
BUN: 13 mg/dL (ref 7–25)
CALCIUM: 8.8 mg/dL (ref 8.6–10.3)
CO2: 30 mmol/L (ref 20–32)
CREATININE: 0.97 mg/dL (ref 0.70–1.33)
Chloride: 102 mmol/L (ref 98–110)
GFR, EST AFRICAN AMERICAN: 103 mL/min/{1.73_m2} (ref >=60)
GFR, EST NON AFRICAN AMERICAN: 89 mL/min/{1.73_m2} (ref >=60)
GLUCOSE: 99 mg/dL (ref 65–139)
Globulin: 2.8 g/dL (calc) (ref 1.9–3.7)
Potassium: 3.3 mmol/L — ABNORMAL LOW (ref 3.5–5.3)
Sodium: 140 mmol/L (ref 135–146)
TOTAL PROTEIN: 6.2 g/dL (ref 6.1–8.1)
Total Bilirubin: 0.3 mg/dL (ref 0.2–1.2)

## 2016-11-20 LAB — CBC WITH DIFFERENTIAL/PLATELET
BASOS ABS: 63 {cells}/uL (ref 0–200)
Basophils Relative: 0.9 %
Eosinophils Absolute: 259 cells/uL (ref 15–500)
Eosinophils Relative: 3.7 %
HEMATOCRIT: 40.1 % (ref 38.5–50.0)
Hemoglobin: 13.5 g/dL (ref 13.2–17.1)
LYMPHS ABS: 3017 {cells}/uL (ref 850–3900)
MCH: 29.4 pg (ref 27.0–33.0)
MCHC: 33.7 g/dL (ref 32.0–36.0)
MCV: 87.4 fL (ref 80.0–100.0)
MPV: 8.8 fL (ref 7.5–12.5)
Monocytes Relative: 6.8 %
NEUTROS PCT: 45.5 %
Neutro Abs: 3185 cells/uL (ref 1500–7800)
PLATELETS: 399 10*3/uL (ref 140–400)
RBC: 4.59 10*6/uL (ref 4.20–5.80)
RDW: 12.7 % (ref 11.0–15.0)
TOTAL LYMPHOCYTE: 43.1 %
WBC: 7 10*3/uL (ref 3.8–10.8)
WBCMIX: 476 {cells}/uL (ref 200–950)

## 2016-11-20 LAB — LIPID PANEL
CHOLESTEROL: 171 mg/dL (ref <200)
HDL: 41 mg/dL (ref >40)
LDL CHOLESTEROL (CALC): 107 mg/dL — AB
Non-HDL Cholesterol (Calc): 130 mg/dL (calc) — ABNORMAL HIGH (ref <130)
Total CHOL/HDL Ratio: 4.2 (calc) (ref <5.0)
Triglycerides: 118 mg/dL (ref <150)

## 2016-11-20 LAB — HEMOGLOBIN A1C
EAG (MMOL/L): 5.8 (calc)
Hgb A1c MFr Bld: 5.3 % of total Hgb (ref <5.7)
Mean Plasma Glucose: 105 (calc)

## 2016-12-09 ENCOUNTER — Inpatient Hospital Stay: Admission: RE | Admit: 2016-12-09 | Payer: Medicare HMO | Source: Ambulatory Visit

## 2016-12-13 DIAGNOSIS — M2570 Osteophyte, unspecified joint: Secondary | ICD-10-CM | POA: Diagnosis not present

## 2016-12-13 DIAGNOSIS — M2042 Other hammer toe(s) (acquired), left foot: Secondary | ICD-10-CM | POA: Diagnosis not present

## 2016-12-16 ENCOUNTER — Inpatient Hospital Stay: Admission: RE | Admit: 2016-12-16 | Payer: Medicare HMO | Source: Ambulatory Visit

## 2016-12-20 ENCOUNTER — Ambulatory Visit: Admission: RE | Admit: 2016-12-20 | Payer: Medicare HMO | Source: Ambulatory Visit | Admitting: Podiatry

## 2016-12-20 ENCOUNTER — Encounter: Admission: RE | Payer: Self-pay | Source: Ambulatory Visit

## 2016-12-20 SURGERY — CORRECTION, HAMMER TOE
Anesthesia: Choice | Laterality: Left

## 2017-01-20 ENCOUNTER — Other Ambulatory Visit: Payer: Self-pay | Admitting: Podiatry

## 2017-02-04 ENCOUNTER — Ambulatory Visit: Payer: Medicare HMO | Admitting: Family Medicine

## 2017-02-04 ENCOUNTER — Encounter: Payer: Self-pay | Admitting: Family Medicine

## 2017-02-04 ENCOUNTER — Ambulatory Visit: Payer: Medicare HMO

## 2017-02-04 ENCOUNTER — Telehealth: Payer: Self-pay | Admitting: Family Medicine

## 2017-02-04 VITALS — BP 134/72 | HR 72 | Temp 97.7°F | Resp 16 | Ht 69.0 in | Wt 171.2 lb

## 2017-02-04 VITALS — BP 134/72 | HR 72 | Temp 97.7°F | Resp 16 | Ht 72.0 in | Wt 171.2 lb

## 2017-02-04 DIAGNOSIS — Z0000 Encounter for general adult medical examination without abnormal findings: Secondary | ICD-10-CM

## 2017-02-04 DIAGNOSIS — Z01818 Encounter for other preprocedural examination: Secondary | ICD-10-CM | POA: Diagnosis not present

## 2017-02-04 DIAGNOSIS — M2042 Other hammer toe(s) (acquired), left foot: Secondary | ICD-10-CM | POA: Diagnosis not present

## 2017-02-04 DIAGNOSIS — Z23 Encounter for immunization: Secondary | ICD-10-CM

## 2017-02-04 NOTE — Patient Instructions (Signed)
Craig Nolan , Thank you for taking time to come for your Medicare Wellness Visit. I appreciate your ongoing commitment to your health goals. Please review the following plan we discussed and let me know if I can assist you in the future.   Screening recommendations/referrals: Colonoscopy: completed 03/12/2007 Recommended yearly ophthalmology/optometry visit for glaucoma screening and checkup Recommended yearly dental visit for hygiene and checkup  Vaccinations: Influenza vaccine: done today Pneumococcal vaccine: due at age 53 Tdap vaccine: due, check with your insurance company for coverage Shingles vaccine: due, check with your insurance company for coverage  Advanced directives: Advance directive discussed with you today. I have provided a copy for you to complete at home and have notarized. Once this is complete please bring a copy in to our office so we can scan it into your chart.  Conditions/risks identified: smoking cessation discussed  Next appointment: Follow up in one year for your annual wellness exam.   Preventive Care 40-64 Years, Male Preventive care refers to lifestyle choices and visits with your health care provider that can promote health and wellness. What does preventive care include?  A yearly physical exam. This is also called an annual well check.  Dental exams once or twice a year.  Routine eye exams. Ask your health care provider how often you should have your eyes checked.  Personal lifestyle choices, including:  Daily care of your teeth and gums.  Regular physical activity.  Eating a healthy diet.  Avoiding tobacco and drug use.  Limiting alcohol use.  Practicing safe sex.  Taking low-dose aspirin every day starting at age 53 What happens during an annual well check? The services and screenings done by your health care provider during your annual well check will depend on your age, overall health, lifestyle risk factors, and family history of  disease. Counseling  Your health care provider may ask you questions about your:  Alcohol use.  Tobacco use.  Drug use.  Emotional well-being.  Home and relationship well-being.  Sexual activity.  Eating habits.  Work and work Statistician. Screening  You may have the following tests or measurements:  Height, weight, and BMI.  Blood pressure.  Lipid and cholesterol levels. These may be checked every 5 years, or more frequently if you are over 2 years old.  Skin check.  Lung cancer screening. You may have this screening every year starting at age 22 if you have a 30-pack-year history of smoking and currently smoke or have quit within the past 15 years.  Fecal occult blood test (FOBT) of the stool. You may have this test every year starting at age 92.  Flexible sigmoidoscopy or colonoscopy. You may have a sigmoidoscopy every 5 years or a colonoscopy every 10 years starting at age 71.  Prostate cancer screening. Recommendations will vary depending on your family history and other risks.  Hepatitis C blood test.  Hepatitis B blood test.  Sexually transmitted disease (STD) testing.  Diabetes screening. This is done by checking your blood sugar (glucose) after you have not eaten for a while (fasting). You may have this done every 1-3 years. Discuss your test results, treatment options, and if necessary, the need for more tests with your health care provider. Vaccines  Your health care provider may recommend certain vaccines, such as:  Influenza vaccine. This is recommended every year.  Tetanus, diphtheria, and acellular pertussis (Tdap, Td) vaccine. You may need a Td booster every 10 years.  Zoster vaccine. You may need this after age  53  Pneumococcal 13-valent conjugate (PCV13) vaccine. You may need this if you have certain conditions and have not been vaccinated.  Pneumococcal polysaccharide (PPSV23) vaccine. You may need one or two doses if you smoke cigarettes  or if you have certain conditions. Talk to your health care provider about which screenings and vaccines you need and how often you need them. This information is not intended to replace advice given to you by your health care provider. Make sure you discuss any questions you have with your health care provider. Document Released: 03/24/2015 Document Revised: 11/15/2015 Document Reviewed: 12/27/2014 Elsevier Interactive Patient Education  2017 [employer] Prevention in the Home Falls can cause injuries. They can happen to people of all ages. There are many things you can do to make your home safe and to help prevent falls. What can I do on the outside of my home?  Regularly fix the edges of walkways and driveways and fix any cracks.  Remove anything that might make you trip as you walk through a door, such as a raised step or threshold.  Trim any bushes or trees on the path to your home.  Use bright outdoor lighting.  Clear any walking paths of anything that might make someone trip, such as rocks or tools.  Regularly check to see if handrails are loose or broken. Make sure that both sides of any steps have handrails.  Any raised decks and porches should have guardrails on the edges.  Have any leaves, snow, or ice cleared regularly.  Use sand or salt on walking paths during winter.  Clean up any spills in your garage right away. This includes oil or grease spills. What can I do in the bathroom?  Use night lights.  Install grab bars by the toilet and in the tub and shower. Do not use towel bars as grab bars.  Use non-skid mats or decals in the tub or shower.  If you need to sit down in the shower, use a plastic, non-slip stool.  Keep the floor dry. Clean up any water that spills on the floor as soon as it happens.  Remove soap buildup in the tub or shower regularly.  Attach bath mats securely with double-sided non-slip rug tape.  Do not have throw rugs and other  things on the floor that can make you trip. What can I do in the bedroom?  Use night lights.  Make sure that you have a light by your bed that is easy to reach.  Do not use any sheets or blankets that are too big for your bed. They should not hang down onto the floor.  Have a firm chair that has side arms. You can use this for support while you get dressed.  Do not have throw rugs and other things on the floor that can make you trip. What can I do in the kitchen?  Clean up any spills right away.  Avoid walking on wet floors.  Keep items that you use a lot in easy-to-reach places.  If you need to reach something above you, use a strong step stool that has a grab bar.  Keep electrical cords out of the way.  Do not use floor polish or wax that makes floors slippery. If you must use wax, use non-skid floor wax.  Do not have throw rugs and other things on the floor that can make you trip. What can I do with my stairs?  Do not leave any items  on the stairs.  Make sure that there are handrails on both sides of the stairs and use them. Fix handrails that are broken or loose. Make sure that handrails are as long as the stairways.  Check any carpeting to make sure that it is firmly attached to the stairs. Fix any carpet that is loose or worn.  Avoid having throw rugs at the top or bottom of the stairs. If you do have throw rugs, attach them to the floor with carpet tape.  Make sure that you have a light switch at the top of the stairs and the bottom of the stairs. If you do not have them, ask someone to add them for you. What else can I do to help prevent falls?  Wear shoes that:  Do not have high heels.  Have rubber bottoms.  Are comfortable and fit you well.  Are closed at the toe. Do not wear sandals.  If you use a stepladder:  Make sure that it is fully opened. Do not climb a closed stepladder.  Make sure that both sides of the stepladder are locked into place.  Ask  someone to hold it for you, if possible.  Clearly mark and make sure that you can see:  Any grab bars or handrails.  First and last steps.  Where the edge of each step is.  Use tools that help you move around (mobility aids) if they are needed. These include:  Canes.  Walkers.  Scooters.  Crutches.  Turn on the lights when you go into a dark area. Replace any light bulbs as soon as they burn out.  Set up your furniture so you have a clear path. Avoid moving your furniture around.  If any of your floors are uneven, fix them.  If there are any pets around you, be aware of where they are.  Review your medicines with your doctor. Some medicines can make you feel dizzy. This can increase your chance of falling. Ask your doctor what other things that you can do to help prevent falls. This information is not intended to replace advice given to you by your health care provider. Make sure you discuss any questions you have with your health care provider. Document Released: 12/22/2008 Document Revised: 08/03/2015 Document Reviewed: 04/01/2014 Elsevier Interactive Patient Education  2017 [employer].

## 2017-02-04 NOTE — H&P (View-Only) (Signed)
Subjective:    Patient ID: LENVILLE HIBBERD, male    DOB: 24-Jan-1966, 51 y.o.   MRN: [id number]  HRIDAAN BOUSE is a 51 y.o. male presenting on 02/04/2017 for Foot Pain (need referral)   HPI  Medical Pre-Op Clearance / Left Foot Surgery (4-5th digit hammer toe) - Last visit with me 11/15/16, for Pre-Operative Clearance for Podiatry L Foot Surgery, patient had initial work up with labs + EKG and was ultimately cleared to proceed with podiatry L foot surgery by [facility] Podiat[facility] Dr Cleda Mccreedy. However patient cancelled / re-scheduled his surgery, and now is back requesting repeat form completion for medical clearance, (required within 30 days before surgery), now scheduled for 02/21/17. See prior notes for background information. - Interval update with no significant change still has L foot pain, wearing walker boot to assist with offloading pressure, worse wearing shoe causing more narrow forefoot and pain - Today patient reports no new concerns  Last seen by Dr Cleda Mccreedy ([place]) on 11/14/16 for initial eval of Left Foot Hammer Toes, 4th-5th digits, he had X-ray done at that time that confirmed finding. Patient experiencing significant pain and some numbness affecting these toes limiting his function and ambulation. He had similar surgery done in past on R foot. - He is here for medical clearance - He reports that he does not have any known history of cardiovascular disease. No prior MI or CVA. - He is able to tolerate regular exercise up to >4 METs including walking up 2 flights of stairs without dyspnea, or other mild to moderate exertion without exertional symptoms - No known history of problems with anesthesia. He has had several surgeries in past including R foot surgery by Dr Cleda Mccreedy, Back Surgery, Cholecystectomy, Knee Surgery - PMH: Active smoker (now reduced to < 1ppd, presumed COPD without formal lung testing, history of psychiatric illness with bipolar and depression and insomnia but off of  medications now, other chronic pain syndrome with osteoarthritis and back pain)  Depression screen [facility] 2/9 02/04/2017 05/17/2016 07/17/2015  Decreased Interest 0 0 3  Down, Depressed, Hopeless 0 0 1  PHQ - 2 Score 0 0 4  Altered sleeping - - 3  Tired, decreased energy - - 3  Change in appetite - - 3  Feeling bad or failure about yourself  - - 3  Trouble concentrating - - 3  Moving slowly or fidgety/restless - - 3  Suicidal thoughts - - 0  PHQ-9 Score - - 22  Difficult doing work/chores - - Somewhat difficult    Past Surgical History:  Procedure Laterality Date  . BACK SURGERY    . CHOLECYSTECTOMY    . COLONOSCOPY    . CYSTECTOMY    . FOOT SURGERY Right   . KNEE SURGERY      Family History  Problem Relation Age of Onset  . Diabetes Mother   . Cancer Father      Social History   Tobacco Use  . Smoking status: Current Every Day Smoker    Packs/day: 2.00    Years: 20.00    Pack years: 40.00    Types: Cigarettes  . Smokeless tobacco: Never Used  Substance Use Topics  . Alcohol use: No    Alcohol/week: 0.0 oz    Comment: stopped 3 years  . Drug use: No    Comment: stopped 15 years ago     Review of Systems Per HPI unless specifically indicated above     Objective:  BP 134/72   Pulse 72   Temp 97.7 F (36.5 C) (Oral)   Resp 16   Ht 6' (1.829 m)   Wt 171 lb 3.2 oz (77.7 kg)   BMI 23.22 kg/m   Wt Readings from Last 3 Encounters:  02/04/17 171 lb 3.2 oz (77.7 kg)  02/04/17 171 lb 3.2 oz (77.7 kg)  11/15/16 159 lb 9.6 oz (72.4 kg)    Physical Exam  Constitutional: He is oriented to person, place, and time. He appears well-developed and well-nourished. No distress.  Well-appearing, comfortable, cooperative  HENT:  Head: Normocephalic and atraumatic.  Mouth/Throat: Oropharynx is clear and moist.  Eyes: Conjunctivae are normal. Right eye exhibits no discharge. Left eye exhibits no discharge.  Neck: Normal range of motion. Neck supple. No thyromegaly  present.  Cardiovascular: Normal rate, regular rhythm, normal heart sounds and intact distal pulses.  No murmur heard. Pulmonary/Chest: Effort normal. No respiratory distress. He has no rales.  Mild scattered exp wheezes and some slightly coarse breath sounds at bases that improve with cough  Musculoskeletal: Normal range of motion. He exhibits no edema.  Left Foot with 4-5th digit hammer toe deformity, callus formation, limited mobility due to pain. Reduced sensation to touch. Intact pulse.  Lymphadenopathy:    He has no cervical adenopathy.  Neurological: He is alert and oriented to person, place, and time.  Skin: Skin is warm and dry. No rash noted. He is not diaphoretic. No erythema.  Psychiatric: He has a normal mood and affect. His behavior is normal.  Good eye contact, normal speech and thoughts, does seem drowsy today with poor sleep  Nursing note and vitals reviewed.  Results for orders placed or performed in visit on 11/19/16  COMPLETE METABOLIC PANEL WITH GFR  Result Value Ref Range   Glucose, Bld 99 65 - 139 mg/dL   BUN 13 7 - 25 mg/dL   Creat 0.97 0.70 - 1.33 mg/dL   GFR, Est Non African American 89 > OR = 60 mL/min/1.68m2   GFR, Est African American 103 > OR = 60 mL/min/1.36m2   BUN/Creatinine Ratio NOT APPLICABLE 6 - 22 (calc)   Sodium 140 135 - 146 mmol/L   Potassium 3.3 (L) 3.5 - 5.3 mmol/L   Chloride 102 98 - 110 mmol/L   CO2 30 20 - 32 mmol/L   Calcium 8.8 8.6 - 10.3 mg/dL   Total Protein 6.2 6.1 - 8.1 g/dL   Albumin 3.4 (L) 3.6 - 5.1 g/dL   Globulin 2.8 1.9 - 3.7 g/dL (calc)   AG Ratio 1.2 1.0 - 2.5 (calc)   Total Bilirubin 0.3 0.2 - 1.2 mg/dL   Alkaline phosphatase (APISO) 89 40 - 115 U/L   AST 16 10 - 35 U/L   ALT 8 (L) 9 - 46 U/L  Hemoglobin A1c  Result Value Ref Range   Hgb A1c MFr Bld 5.3 <5.7 % of total Hgb   Mean Plasma Glucose 105 (calc)   eAG (mmol/L) 5.8 (calc)  Lipid panel  Result Value Ref Range   Cholesterol 171 <200 mg/dL   HDL 41 >40  mg/dL   Triglycerides 118 <150 mg/dL   LDL Cholesterol (Calc) 107 (H) mg/dL (calc)   Total CHOL/HDL Ratio 4.2 <5.0 (calc)   Non-HDL Cholesterol (Calc) 130 (H) <130 mg/dL (calc)  CBC with Differential/Platelet  Result Value Ref Range   WBC 7.0 3.8 - 10.8 Thousand/uL   RBC 4.59 4.20 - 5.80 Million/uL   Hemoglobin 13.5 13.2 - 17.1 g/dL  HCT 40.1 38.5 - 50.0 %   MCV 87.4 80.0 - 100.0 fL   MCH 29.4 27.0 - 33.0 pg   MCHC 33.7 32.0 - 36.0 g/dL   RDW 12.7 11.0 - 15.0 %   Platelets 399 140 - 400 Thousand/uL   MPV 8.8 7.5 - 12.5 fL   Neutro Abs 3,185 1,500 - 7,800 cells/uL   Lymphs Abs 3,017 850 - 3,900 cells/uL   WBC mixed population 476 200 - 950 cells/uL   Eosinophils Absolute 259 15 - 500 cells/uL   Basophils Absolute 63 0 - 200 cells/uL   Neutrophils Relative % 45.5 %   Total Lymphocyte 43.1 %   Monocytes Relative 6.8 %   Eosinophils Relative 3.7 %   Basophils Relative 0.9 %      Assessment & Plan:   Problem List Items Addressed This Visit    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 - Previously tolerated multiple surgeries with general anesthesia - No known cardiac hx. Appropriate functional status >4 METs - Active smoker (increased risk) with presumed COPD without confirmation. No recent URI or exacerbation - Previous labs reviewed from 11/19/16 with normal Cr, A1c - Prior EKG 11/15/16 reviewed, compared to 2015, unchanged - Last CXR 2010, offered today, patient declined, would not limit proceeding with surgery due to chest x-ray at this time  He is Medically Cleared for elective surgery. Completed provided pre-op paperwork per [facility] Podiatry office  Revised Cardiac Risk Index (RCRI) Rate of cardiac death, nonfatal MI, and nonfatal cardiac arrest =  No risk factors - 0.4 percent (95% CI: 0.1-0.8)   No orders of  the defined types were placed in this encounter.  Follow up plan: Return if symptoms worsen or fail to improve.  Nobie Putnam, DO [facility] 02/04/2017, 6:15 PM

## 2017-02-04 NOTE — Patient Instructions (Signed)
Thank you for coming to the clinic today.  1. You are cleared for surgery.  Form will be faxed tomorrow  Please schedule a Follow-up Appointment to: No Follow-up on file.  If you have any other questions or concerns, please feel free to call the clinic or send a message through [place]. You may also schedule an earlier appointment if necessary.  Additionally, you may be receiving a survey about your experience at our clinic within a few days to 1 week by e-mail or mail. We value your feedback.  Nobie Putnam, DO [place]

## 2017-02-04 NOTE — Progress Notes (Signed)
Subjective:   Craig Nolan is a 51 y.o. male who presents for Medicare Annual/Subsequent preventive examination.  Review of Systems:   Cardiac Risk Factors include: hypertension;advanced age (>59men, >47 women);male gender;dyslipidemia     Objective:    Vitals: BP 134/72 (BP Location: Left Arm, Patient Position: Sitting)   Pulse 72   Temp 97.7 F (36.5 C) (Oral)   Resp 16   Ht 5\' 9"  (1.753 m)   Wt 171 lb 3.2 oz (77.7 kg)   BMI 25.28 kg/m   Body mass index is 25.28 kg/m.  Tobacco Social History   Tobacco Use  Smoking Status Current Every Day Smoker  . Packs/day: 2.00  . Years: 20.00  . Pack years: 40.00  . Types: Cigarettes  Smokeless Tobacco Never Used     Ready to quit: No Counseling given: No   Past Medical History:  Diagnosis Date  . Bipolar disorder ([place])   . Drug abuse ([place])   . Dyslipidemia   . Hemorrhoids   . Hyperlipidemia   . Hypertension   . PTSD (post-traumatic stress disorder)   . Seasonal allergies   . Sleep apnea   . Tobacco use    Past Surgical History:  Procedure Laterality Date  . BACK SURGERY    . CHOLECYSTECTOMY    . COLONOSCOPY    . CYSTECTOMY    . FOOT SURGERY Right   . KNEE SURGERY     Family History  Problem Relation Age of Onset  . Diabetes Mother   . Cancer Father    Social History   Substance and Sexual Activity  Sexual Activity Not on file    Outpatient Encounter Medications as of 02/04/2017  Medication Sig  . baclofen (LIORESAL) 10 MG tablet Take 0.5-1 tablets (5-10 mg total) by mouth 3 (three) times daily as needed for muscle spasms. (Patient not taking: Reported on 02/04/2017)  . Multiple Vitamins-Minerals (CENTRUM SILVER ADULT 50+ PO) Take 1 tablet by mouth daily.  Marland Kitchen omeprazole (PRILOSEC) 20 MG capsule Take 20 mg by mouth daily.  . ranitidine (ZANTAC) 150 MG capsule Take 150 mg by mouth 2 (two) times daily.  . Wheat Dextrin (BENEFIBER) POWD Stir 2 tsp. TID into 4-8 oz of any non-carbonated beverage or  soft food (hot or cold) (Patient not taking: Reported on 02/04/2017)   No facility-administered encounter medications on file as of 02/04/2017.     Activities of Daily Living In your present state of health, do you have any difficulty performing the following activities: 02/04/2017 05/17/2016  Hearing? N N  Vision? Y N  Difficulty concentrating or making decisions? Y N  Walking or climbing stairs? Y N  Comment due to pain -  Dressing or bathing? N N  Doing errands, shopping? N N  Preparing Food and eating ? N -  Using the Toilet? N -  In the past six months, have you accidently leaked urine? N -  Do you have problems with loss of bowel control? N -  Managing your Medications? N -  Managing your Finances? N -  Housekeeping or managing your Housekeeping? N -  Some recent data might be hidden    Patient Care Team: Olin Hauser, DO as PCP - General (Family Medicine) Sharlotte Alamo, DPM (Podiatry)   Assessment:     Exercise Activities and Dietary recommendations Current Exercise Habits: The patient has a physically strenous job, but has no regular exercise apart from work., Exercise limited by: None identified  Goals    .  Quit Smoking      Fall Risk Fall Risk  02/04/2017 05/17/2016 07/17/2015 06/22/2015 06/22/2015  Falls in the past year? No No Yes Yes No  Number falls in past yr: - - 1 1 -  Injury with Fall? - - Yes No -  Comment - - did not go to MD, feet went out  - -  Risk Factor Category  - - High Fall Risk - -  Risk for fall due to : - - Impaired mobility (No Data) -  Risk for fall due to: Comment - - - slipped  -  Follow up - - - Falls prevention discussed -   Depression Screen PHQ 2/9 Scores 02/04/2017 05/17/2016 07/17/2015 06/22/2015  PHQ - 2 Score 0 0 4 6  PHQ- 9 Score - - 22 24  Exception Documentation - - - Medical reason    Cognitive Function     6CIT Screen 02/04/2017  What Year? 0 points  What month? 0 points  What time? 0 points  Count back from 20  0 points  Months in reverse 0 points  Repeat phrase 0 points  Total Score 0    Immunization History  Administered Date(s) Administered  . Influenza,inj,Quad PF,6+ Mos 02/04/2017   Screening Tests Health Maintenance  Topic Date Due  . Hepatitis C Screening  11/09/2017 (Originally 1966/01/05)  . HIV Screening  11/09/2017 (Originally 03/21/1978)  . TETANUS/TDAP  02/04/2018 (Originally 03/21/1982)  . COLONOSCOPY  03/11/2017  . INFLUENZA VACCINE  Completed      Plan:    I have personally reviewed and addressed the Medicare Annual Wellness questionnaire and have noted the following in the patient's chart:  A. Medical and social history B. Use of alcohol, tobacco or illicit drugs  C. Current medications and supplements D. Functional ability and status E.  Nutritional status F.  Physical activity G. Advance directives H. List of other physicians I.  Hospitalizations, surgeries, and ER visits in previous 12 months J.  [place] such as hearing and vision if needed, cognitive and depression L. Referrals and appointments   In addition, I have reviewed and discussed with patient certain preventive protocols, quality metrics, and best practice recommendations. A written personalized care plan for preventive services as well as general preventive health recommendations were provided to patient.   Signed,  Tyler Aas, LPN Nurse Health Advisor   Nurse Notes: none

## 2017-02-04 NOTE — Progress Notes (Signed)
Subjective:    Patient ID: Craig Nolan, male    DOB: [birth year], 51 y.o.   MRN: [id number]  Craig Nolan is a 51 y.o. male presenting on 02/04/2017 for Foot Pain (need referral)   HPI  Medical Pre-Op Clearance / Left Foot Surgery (4-5th digit hammer toe) - Last visit with me 11/15/16, for Pre-Operative Clearance for Podiatry L Foot Surgery, patient had initial work up with labs + EKG and was ultimately cleared to proceed with podiatry L foot surgery by [facility] Podiatry Dr Cleda Mccreedy. However patient cancelled / re-scheduled his surgery, and now is back requesting repeat form completion for medical clearance, (required within 30 days before surgery), now scheduled for 02/21/17. See prior notes for background information. - Interval update with no significant change still has L foot pain, wearing walker boot to assist with offloading pressure, worse wearing shoe causing more narrow forefoot and pain - Today patient reports no new concerns  Last seen by Dr Cleda Mccreedy ([place]) on 11/14/16 for initial eval of Left Foot Hammer Toes, 4th-5th digits, he had X-ray done at that time that confirmed finding. Patient experiencing significant pain and some numbness affecting these toes limiting his function and ambulation. He had similar surgery done in past on R foot. - He is here for medical clearance - He reports that he does not have any known history of cardiovascular disease. No prior MI or CVA. - He is able to tolerate regular exercise up to >4 METs including walking up 2 flights of stairs without dyspnea, or other mild to moderate exertion without exertional symptoms - No known history of problems with anesthesia. He has had several surgeries in past including R foot surgery by Dr Cleda Mccreedy, Back Surgery, Cholecystectomy, Knee Surgery - PMH: Active smoker (now reduced to < 1ppd, presumed COPD without formal lung testing, history of psychiatric illness with bipolar and depression and insomnia but off of  medications now, other chronic pain syndrome with osteoarthritis and back pain)  Depression screen [facility] 2/9 02/04/2017 05/17/2016 07/17/2015  Decreased Interest 0 0 3  Down, Depressed, Hopeless 0 0 1  PHQ - 2 Score 0 0 4  Altered sleeping - - 3  Tired, decreased energy - - 3  Change in appetite - - 3  Feeling bad or failure about yourself  - - 3  Trouble concentrating - - 3  Moving slowly or fidgety/restless - - 3  Suicidal thoughts - - 0  PHQ-9 Score - - 22  Difficult doing work/chores - - Somewhat difficult    Past Surgical History:  Procedure Laterality Date  . BACK SURGERY    . CHOLECYSTECTOMY    . COLONOSCOPY    . CYSTECTOMY    . FOOT SURGERY Right   . KNEE SURGERY      Family History  Problem Relation Age of Onset  . Diabetes Mother   . Cancer Father      Social History   Tobacco Use  . Smoking status: Current Every Day Smoker    Packs/day: 2.00    Years: 20.00    Pack years: 40.00    Types: Cigarettes  . Smokeless tobacco: Never Used  Substance Use Topics  . Alcohol use: No    Alcohol/week: 0.0 oz    Comment: stopped 3 years  . Drug use: No    Comment: stopped 15 years ago     Review of Systems Per HPI unless specifically indicated above     Objective:  BP 134/72   Pulse 72   Temp 97.7 F (36.5 C) (Oral)   Resp 16   Ht 6' (1.829 m)   Wt 171 lb 3.2 oz (77.7 kg)   BMI 23.22 kg/m   Wt Readings from Last 3 Encounters:  02/04/17 171 lb 3.2 oz (77.7 kg)  02/04/17 171 lb 3.2 oz (77.7 kg)  11/15/16 159 lb 9.6 oz (72.4 kg)    Physical Exam  Constitutional: He is oriented to person, place, and time. He appears well-developed and well-nourished. No distress.  Well-appearing, comfortable, cooperative  HENT:  Head: Normocephalic and atraumatic.  Mouth/Throat: Oropharynx is clear and moist.  Eyes: Conjunctivae are normal. Right eye exhibits no discharge. Left eye exhibits no discharge.  Neck: Normal range of motion. Neck supple. No thyromegaly  present.  Cardiovascular: Normal rate, regular rhythm, normal heart sounds and intact distal pulses.  No murmur heard. Pulmonary/Chest: Effort normal. No respiratory distress. He has no rales.  Mild scattered exp wheezes and some slightly coarse breath sounds at bases that improve with cough  Musculoskeletal: Normal range of motion. He exhibits no edema.  Left Foot with 4-5th digit hammer toe deformity, callus formation, limited mobility due to pain. Reduced sensation to touch. Intact pulse.  Lymphadenopathy:    He has no cervical adenopathy.  Neurological: He is alert and oriented to person, place, and time.  Skin: Skin is warm and dry. No rash noted. He is not diaphoretic. No erythema.  Psychiatric: He has a normal mood and affect. His behavior is normal.  Good eye contact, normal speech and thoughts, does seem drowsy today with poor sleep  Nursing note and vitals reviewed.  Results for orders placed or performed in visit on 11/19/16  COMPLETE METABOLIC PANEL WITH GFR  Result Value Ref Range   Glucose, Bld 99 65 - 139 mg/dL   BUN 13 7 - 25 mg/dL   Creat 0.97 0.70 - 1.33 mg/dL   GFR, Est Non African American 89 > OR = 60 mL/min/1.45m2   GFR, Est African American 103 > OR = 60 mL/min/1.41m2   BUN/Creatinine Ratio NOT APPLICABLE 6 - 22 (calc)   Sodium 140 135 - 146 mmol/L   Potassium 3.3 (L) 3.5 - 5.3 mmol/L   Chloride 102 98 - 110 mmol/L   CO2 30 20 - 32 mmol/L   Calcium 8.8 8.6 - 10.3 mg/dL   Total Protein 6.2 6.1 - 8.1 g/dL   Albumin 3.4 (L) 3.6 - 5.1 g/dL   Globulin 2.8 1.9 - 3.7 g/dL (calc)   AG Ratio 1.2 1.0 - 2.5 (calc)   Total Bilirubin 0.3 0.2 - 1.2 mg/dL   Alkaline phosphatase (APISO) 89 40 - 115 U/L   AST 16 10 - 35 U/L   ALT 8 (L) 9 - 46 U/L  Hemoglobin A1c  Result Value Ref Range   Hgb A1c MFr Bld 5.3 <5.7 % of total Hgb   Mean Plasma Glucose 105 (calc)   eAG (mmol/L) 5.8 (calc)  Lipid panel  Result Value Ref Range   Cholesterol 171 <200 mg/dL   HDL 41 >40  mg/dL   Triglycerides 118 <150 mg/dL   LDL Cholesterol (Calc) 107 (H) mg/dL (calc)   Total CHOL/HDL Ratio 4.2 <5.0 (calc)   Non-HDL Cholesterol (Calc) 130 (H) <130 mg/dL (calc)  CBC with Differential/Platelet  Result Value Ref Range   WBC 7.0 3.8 - 10.8 Thousand/uL   RBC 4.59 4.20 - 5.80 Million/uL   Hemoglobin 13.5 13.2 - 17.1 g/dL  HCT 40.1 38.5 - 50.0 %   MCV 87.4 80.0 - 100.0 fL   MCH 29.4 27.0 - 33.0 pg   MCHC 33.7 32.0 - 36.0 g/dL   RDW 12.7 11.0 - 15.0 %   Platelets 399 140 - 400 Thousand/uL   MPV 8.8 7.5 - 12.5 fL   Neutro Abs 3,185 1,500 - 7,800 cells/uL   Lymphs Abs 3,017 850 - 3,900 cells/uL   WBC mixed population 476 200 - 950 cells/uL   Eosinophils Absolute 259 15 - 500 cells/uL   Basophils Absolute 63 0 - 200 cells/uL   Neutrophils Relative % 45.5 %   Total Lymphocyte 43.1 %   Monocytes Relative 6.8 %   Eosinophils Relative 3.7 %   Basophils Relative 0.9 %      Assessment & Plan:   Problem List Items Addressed This Visit    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 - Previously tolerated multiple surgeries with general anesthesia - No known cardiac hx. Appropriate functional status >4 METs - Active smoker (increased risk) with presumed COPD without confirmation. No recent URI or exacerbation - Previous labs reviewed from 11/19/16 with normal Cr, A1c - Prior EKG 11/15/16 reviewed, compared to 2015, unchanged - Last CXR 2010, offered today, patient declined, would not limit proceeding with surgery due to chest x-ray at this time  He is Medically Cleared for elective surgery. Completed provided pre-op paperwork per [facility] Podiatry office  Revised Cardiac Risk Index (RCRI) Rate of cardiac death, nonfatal MI, and nonfatal cardiac arrest =  No risk factors - 0.4 percent (95% CI: 0.1-0.8)   No orders of  the defined types were placed in this encounter.  Follow up plan: Return if symptoms worsen or fail to improve.  Nobie Putnam, DO [facility] 02/04/2017, 6:15 PM

## 2017-02-04 NOTE — Telephone Encounter (Signed)
3:30pm 11/27?? Called pt to sched for AWV with Nurse Health Advisor. C/b #  [phone] on Skype [email or web address] if you have questions

## 2017-02-17 ENCOUNTER — Inpatient Hospital Stay: Admission: RE | Admit: 2017-02-17 | Payer: Medicare HMO | Source: Ambulatory Visit

## 2017-02-19 ENCOUNTER — Other Ambulatory Visit: Payer: Self-pay

## 2017-02-19 ENCOUNTER — Encounter
Admission: RE | Admit: 2017-02-19 | Discharge: 2017-02-19 | Disposition: A | Discharge status: Home / Self Care | Payer: Medicare HMO | Source: Ambulatory Visit | Attending: Podiatry | Admitting: Podiatry

## 2017-02-19 DIAGNOSIS — M2042 Other hammer toe(s) (acquired), left foot: Secondary | ICD-10-CM | POA: Diagnosis not present

## 2017-02-19 DIAGNOSIS — M1990 Unspecified osteoarthritis, unspecified site: Secondary | ICD-10-CM | POA: Diagnosis not present

## 2017-02-19 DIAGNOSIS — I10 Essential (primary) hypertension: Secondary | ICD-10-CM | POA: Diagnosis not present

## 2017-02-19 DIAGNOSIS — J449 Chronic obstructive pulmonary disease, unspecified: Secondary | ICD-10-CM | POA: Diagnosis not present

## 2017-02-19 DIAGNOSIS — F17210 Nicotine dependence, cigarettes, uncomplicated: Secondary | ICD-10-CM | POA: Diagnosis not present

## 2017-02-19 DIAGNOSIS — Z79899 Other long term (current) drug therapy: Secondary | ICD-10-CM | POA: Diagnosis not present

## 2017-02-19 DIAGNOSIS — E785 Hyperlipidemia, unspecified: Secondary | ICD-10-CM | POA: Diagnosis not present

## 2017-02-19 DIAGNOSIS — M898X9 Other specified disorders of bone, unspecified site: Secondary | ICD-10-CM | POA: Diagnosis not present

## 2017-02-19 DIAGNOSIS — F319 Bipolar disorder, unspecified: Secondary | ICD-10-CM | POA: Diagnosis not present

## 2017-02-19 DIAGNOSIS — G4700 Insomnia, unspecified: Secondary | ICD-10-CM | POA: Diagnosis not present

## 2017-02-19 LAB — POTASSIUM: Potassium: 3.5 mmol/L (ref 3.5–5.1)

## 2017-02-19 NOTE — Patient Instructions (Signed)
Your procedure is scheduled on: Friday, February 21, 2017 Report to Same Day Surgery on the 2nd floor in the [place]. To find out your arrival time, please call [phone] between 1PM - 3PM on: Thursday, February 20, 2017  REMEMBER: Instructions that are not followed completely may result in serious medical risk, up to and including death; or upon the discretion of your surgeon and anesthesiologist your surgery may need to be rescheduled.  Do not eat food after midnight the night before your procedure.  No gum chewing or hard candies.  You may however, drink CLEAR liquids up to 2 hours before you are scheduled to arrive at the hospital for your procedure.  Do not drink clear liquids within 2 hours of the start of your surgery.  Clear liquids include: - water  - apple juice without pulp - clear gatorade - black coffee or tea (Do NOT add anything to the coffee or tea) Do NOT drink anything that is not on this list.  No Alcohol for 24 hours before or after surgery.  No Smoking including e-cigarettes for 24 hours prior to surgery. No chewable tobacco products for at least 6 hours prior to surgery. No nicotine patches on the day of surgery.  Notify your doctor if there is any change in your medical condition (cold, fever, infection).  Do not wear jewelry, make-up, hairpins, clips or nail polish.  Do not wear lotions, powders, or perfumes. You may wear deodorant.  Do not shave 48 hours prior to surgery. Men may shave face and neck.  Contacts and dentures may not be worn into surgery.  Do not bring valuables to the hospital. [facility] is not responsible for any belongings or valuables.   TAKE THESE MEDICATIONS THE MORNING OF SURGERY WITH A SIP OF WATER:  1.  RANITIDINE (take one the night before surgery and one the morning of surgery)  Use CHG Soap as directed on instruction sheet.  NOW!   Stop Anti-inflammatories such as Advil, Aleve, Ibuprofen, Motrin, Naproxen,  Naprosyn, Goodie powder, or aspirin products. (May take Tylenol or Acetaminophen if needed.)  NOW!  Stop ANY OVER THE COUNTER supplements until after surgery.   If you are being discharged the day of surgery, you will not be allowed to drive home. You will need someone to drive you home and stay with you that night.   If you are taking public transportation, you will need to have a responsible adult to with you.  Please call the number above if you have any questions about these instructions.

## 2017-02-21 ENCOUNTER — Encounter
Admission: RE | Disposition: A | Discharge status: Home / Self Care | Payer: Self-pay | Source: Ambulatory Visit | Attending: Podiatry

## 2017-02-21 ENCOUNTER — Encounter: Payer: Self-pay | Admitting: Emergency Medicine

## 2017-02-21 ENCOUNTER — Ambulatory Visit
Admission: RE | Admit: 2017-02-21 | Discharge: 2017-02-21 | Disposition: A | Discharge status: Home / Self Care | Payer: Medicare HMO | Source: Ambulatory Visit | Attending: Podiatry | Admitting: Podiatry

## 2017-02-21 ENCOUNTER — Ambulatory Visit: Payer: Medicare HMO | Admitting: Certified Registered"

## 2017-02-21 DIAGNOSIS — J449 Chronic obstructive pulmonary disease, unspecified: Secondary | ICD-10-CM | POA: Diagnosis not present

## 2017-02-21 DIAGNOSIS — E785 Hyperlipidemia, unspecified: Secondary | ICD-10-CM | POA: Diagnosis not present

## 2017-02-21 DIAGNOSIS — M2042 Other hammer toe(s) (acquired), left foot: Secondary | ICD-10-CM | POA: Diagnosis not present

## 2017-02-21 DIAGNOSIS — Z79899 Other long term (current) drug therapy: Secondary | ICD-10-CM | POA: Diagnosis not present

## 2017-02-21 DIAGNOSIS — F319 Bipolar disorder, unspecified: Secondary | ICD-10-CM | POA: Insufficient documentation

## 2017-02-21 DIAGNOSIS — M1990 Unspecified osteoarthritis, unspecified site: Secondary | ICD-10-CM | POA: Insufficient documentation

## 2017-02-21 DIAGNOSIS — F17210 Nicotine dependence, cigarettes, uncomplicated: Secondary | ICD-10-CM | POA: Insufficient documentation

## 2017-02-21 DIAGNOSIS — M2570 Osteophyte, unspecified joint: Secondary | ICD-10-CM | POA: Diagnosis not present

## 2017-02-21 DIAGNOSIS — I10 Essential (primary) hypertension: Secondary | ICD-10-CM | POA: Diagnosis not present

## 2017-02-21 DIAGNOSIS — G4700 Insomnia, unspecified: Secondary | ICD-10-CM | POA: Insufficient documentation

## 2017-02-21 DIAGNOSIS — M898X9 Other specified disorders of bone, unspecified site: Secondary | ICD-10-CM | POA: Diagnosis not present

## 2017-02-21 DIAGNOSIS — G4730 Sleep apnea, unspecified: Secondary | ICD-10-CM | POA: Diagnosis not present

## 2017-02-21 HISTORY — PX: HAMMER TOE SURGERY: SHX385

## 2017-02-21 LAB — URINE DRUG SCREEN, QUALITATIVE (ARMC ONLY)
Amphetamines, Ur Screen: POSITIVE — AB
Barbiturates, Ur Screen: NOT DETECTED
Benzodiazepine, Ur Scrn: NOT DETECTED
CANNABINOID 50 NG, UR ~~LOC~~: POSITIVE — AB
COCAINE METABOLITE, UR ~~LOC~~: NOT DETECTED
MDMA (ECSTASY) UR SCREEN: NOT DETECTED
Methadone Scn, Ur: NOT DETECTED
OPIATE, UR SCREEN: NOT DETECTED
Phencyclidine (PCP) Ur S: NOT DETECTED
TRICYCLIC, UR SCREEN: NOT DETECTED

## 2017-02-21 SURGERY — CORRECTION, HAMMER TOE
Anesthesia: General | Site: Foot | Laterality: Left | Wound class: Clean

## 2017-02-21 MED ORDER — MIDAZOLAM HCL 2 MG/2ML IJ SOLN
INTRAMUSCULAR | Status: AC
  Filled 2017-02-21: qty 2

## 2017-02-21 MED ORDER — DEXAMETHASONE SODIUM PHOSPHATE 10 MG/ML IJ SOLN
INTRAMUSCULAR | Status: AC
  Filled 2017-02-21: qty 1

## 2017-02-21 MED ORDER — FENTANYL CITRATE (PF) 100 MCG/2ML IJ SOLN
INTRAMUSCULAR | Status: DC | PRN
  Administered 2017-02-21: 100 ug via INTRAVENOUS

## 2017-02-21 MED ORDER — MIDAZOLAM HCL 5 MG/5ML IJ SOLN
INTRAMUSCULAR | Status: DC | PRN
  Administered 2017-02-21 (×2): 2 mg via INTRAVENOUS

## 2017-02-21 MED ORDER — PHENYLEPHRINE HCL 10 MG/ML IJ SOLN
INTRAMUSCULAR | Status: AC
  Filled 2017-02-21: qty 1

## 2017-02-21 MED ORDER — ONDANSETRON HCL 4 MG/2ML IJ SOLN
INTRAMUSCULAR | Status: AC
  Filled 2017-02-21: qty 2

## 2017-02-21 MED ORDER — DEXMEDETOMIDINE HCL IN NACL 200 MCG/50ML IV SOLN
INTRAVENOUS | Status: DC | PRN
  Administered 2017-02-21: 8 ug via INTRAVENOUS
  Administered 2017-02-21: 12 ug via INTRAVENOUS

## 2017-02-21 MED ORDER — PROPOFOL 10 MG/ML IV BOLUS
INTRAVENOUS | Status: AC
  Filled 2017-02-21: qty 20

## 2017-02-21 MED ORDER — GLYCOPYRROLATE 0.2 MG/ML IJ SOLN
INTRAMUSCULAR | Status: AC
  Filled 2017-02-21: qty 1

## 2017-02-21 MED ORDER — EPHEDRINE SULFATE 50 MG/ML IJ SOLN
INTRAMUSCULAR | Status: AC
  Filled 2017-02-21: qty 1

## 2017-02-21 MED ORDER — LACTATED RINGERS IV SOLN
INTRAVENOUS | Status: DC
  Administered 2017-02-21: 06:00:00 via INTRAVENOUS

## 2017-02-21 MED ORDER — LIDOCAINE HCL 2 % EX GEL
CUTANEOUS | Status: AC
  Filled 2017-02-21: qty 5

## 2017-02-21 MED ORDER — PROPOFOL 500 MG/50ML IV EMUL
INTRAVENOUS | Status: DC | PRN
  Administered 2017-02-21: 50 ug/kg/min via INTRAVENOUS

## 2017-02-21 MED ORDER — GLYCOPYRROLATE 0.2 MG/ML IJ SOLN
INTRAMUSCULAR | Status: DC | PRN
Start: 2017-02-21 — End: 2017-02-21
  Administered 2017-02-21: 0.2 mg via INTRAVENOUS

## 2017-02-21 MED ORDER — HYDROCODONE-ACETAMINOPHEN 5-325 MG PO TABS: 1.0000 | ORAL_TABLET | ORAL | 0 refills | Status: DC | PRN

## 2017-02-21 MED ORDER — FENTANYL CITRATE (PF) 100 MCG/2ML IJ SOLN: 25.0000 ug | INTRAMUSCULAR | Status: DC | PRN

## 2017-02-21 MED ORDER — LIDOCAINE HCL (PF) 2 % IJ SOLN
INTRAMUSCULAR | Status: AC
  Filled 2017-02-21: qty 10

## 2017-02-21 MED ORDER — DEXMEDETOMIDINE HCL IN NACL 400 MCG/100ML IV SOLN: INTRAVENOUS | Status: DC | PRN

## 2017-02-21 MED ORDER — LIDOCAINE HCL 2 % EX GEL
CUTANEOUS | Status: DC | PRN
  Administered 2017-02-21: 1 via TOPICAL

## 2017-02-21 MED ORDER — ONDANSETRON HCL 4 MG/2ML IJ SOLN: 4.0000 mg | Freq: Once | INTRAMUSCULAR | Status: DC | PRN

## 2017-02-21 MED ORDER — BUPIVACAINE HCL (PF) 0.5 % IJ SOLN
INTRAMUSCULAR | Status: AC
  Filled 2017-02-21: qty 30

## 2017-02-21 MED ORDER — PROPOFOL 10 MG/ML IV BOLUS
INTRAVENOUS | Status: DC | PRN
  Administered 2017-02-21: 50 mg via INTRAVENOUS

## 2017-02-21 MED ORDER — LIDOCAINE 2% (20 MG/ML) 5 ML SYRINGE
INTRAMUSCULAR | Status: DC | PRN
Start: 2017-02-21 — End: 2017-02-21
  Administered 2017-02-21: 50 mg via INTRAVENOUS

## 2017-02-21 MED ORDER — NEOMYCIN-POLYMYXIN B GU 40-200000 IR SOLN
Status: AC
  Filled 2017-02-21: qty 1

## 2017-02-21 MED ORDER — FENTANYL CITRATE (PF) 100 MCG/2ML IJ SOLN
INTRAMUSCULAR | Status: AC
  Filled 2017-02-21: qty 2

## 2017-02-21 MED ORDER — BUPIVACAINE HCL 0.5 % IJ SOLN
INTRAMUSCULAR | Status: DC | PRN
  Administered 2017-02-21: 10 mL

## 2017-02-21 SURGICAL SUPPLY — 39 items
BLADE OSC/SAGITTAL MD 5.5X18 (BLADE) ×3 IMPLANT
BLADE SURG 15 STRL LF DISP TIS (BLADE) ×2 IMPLANT
BLADE SURG 15 STRL SS (BLADE) ×6
BLADE SURG MINI STRL (BLADE) ×3 IMPLANT
BNDG CMPR 75X21 PLY HI ABS (MISCELLANEOUS) ×2
BNDG ESMARK 4X12 TAN STRL LF (GAUZE/BANDAGES/DRESSINGS) ×3 IMPLANT
CANISTER SUCT 1200ML W/VALVE (MISCELLANEOUS) ×3 IMPLANT
CLOSURE WOUND 1/4X4 (GAUZE/BANDAGES/DRESSINGS)
CUFF TOURN 18 STER (MISCELLANEOUS) ×1 IMPLANT
CUFF TOURN DUAL PL 12 NO SLV (MISCELLANEOUS) ×3 IMPLANT
DRAPE FLUOR MINI C-ARM 54X84 (DRAPES) ×3 IMPLANT
DURAPREP 26ML APPLICATOR (WOUND CARE) ×3 IMPLANT
ELECT REM PT RETURN 9FT ADLT (ELECTROSURGICAL) ×3
ELECTRODE REM PT RTRN 9FT ADLT (ELECTROSURGICAL) ×1 IMPLANT
GAUZE PETRO XEROFOAM 1X8 (MISCELLANEOUS) ×3 IMPLANT
GAUZE SPONGE 4X4 12PLY STRL (GAUZE/BANDAGES/DRESSINGS) ×3 IMPLANT
GAUZE STRETCH 2X75IN STRL (MISCELLANEOUS) ×5 IMPLANT
GLOVE BIO SURGEON STRL SZ7.5 (GLOVE) ×3 IMPLANT
GLOVE INDICATOR 8.0 STRL GRN (GLOVE) ×3 IMPLANT
GOWN STRL REUS W/ TWL LRG LVL3 (GOWN DISPOSABLE) ×2 IMPLANT
GOWN STRL REUS W/TWL LRG LVL3 (GOWN DISPOSABLE) ×6
LABEL OR SOLS (LABEL) ×1 IMPLANT
NDL FILTER BLUNT 18X1 1/2 (NEEDLE) ×1 IMPLANT
NDL HYPO 25X1 1.5 SAFETY (NEEDLE) ×2 IMPLANT
NEEDLE FILTER BLUNT 18X 1/2SAF (NEEDLE) ×2
NEEDLE FILTER BLUNT 18X1 1/2 (NEEDLE) ×1 IMPLANT
NEEDLE HYPO 25X1 1.5 SAFETY (NEEDLE) ×3 IMPLANT
NS IRRIG 500ML POUR BTL (IV SOLUTION) ×3 IMPLANT
PACK EXTREMITY ARMC (MISCELLANEOUS) ×3 IMPLANT
SOL PREP PVP 2OZ (MISCELLANEOUS) ×3
SOLUTION PREP PVP 2OZ (MISCELLANEOUS) ×1 IMPLANT
STOCKINETTE STRL 6IN 960660 (GAUZE/BANDAGES/DRESSINGS) ×3 IMPLANT
STRAP SAFETY BODY (MISCELLANEOUS) ×3 IMPLANT
STRIP CLOSURE SKIN 1/4X4 (GAUZE/BANDAGES/DRESSINGS) ×1 IMPLANT
SUT ETHILON 5-0 FS-2 18 BLK (SUTURE) ×3 IMPLANT
SUT VIC AB 4-0 FS2 27 (SUTURE) ×3 IMPLANT
SYR 10ML LL (SYRINGE) ×3 IMPLANT
WIRE Z .045 C-WIRE SPADE TIP (WIRE) ×2 IMPLANT
WIRE Z .062 C-WIRE SPADE TIP (WIRE) ×2 IMPLANT

## 2017-02-21 NOTE — Transfer of Care (Signed)
Immediate Anesthesia Transfer of Care Note  Patient: EDVIN ALBUS  Procedure(s) Performed: HAMMER TOE CORRECTION X2 left 4th and 5th (Left Foot)  Patient Location: PACU  Anesthesia Type:General  Level of Consciousness: sedated  Airway & Oxygen Therapy: Patient Spontanous Breathing and Patient connected to face mask oxygen  Post-op Assessment: Report given to RN and Post -op Vital signs reviewed and stable  Post vital signs: Reviewed and stable  Last Vitals:  Vitals:   02/21/17 0619  BP: 113/84  Pulse: 63  Resp: 17  Temp: (!) 36.1 C  SpO2: 98%    Last Pain:  Vitals:   02/21/17 0619  TempSrc: Tympanic  PainSc: 3          Complications: No apparent anesthesia complications

## 2017-02-21 NOTE — OR Nursing (Signed)
Instructed weight bearing as tolerated with d/c teaching (as per MD orders)

## 2017-02-21 NOTE — Anesthesia Post-op Follow-up Note (Signed)
Anesthesia QCDR form completed.        

## 2017-02-21 NOTE — Anesthesia Preprocedure Evaluation (Signed)
Anesthesia Evaluation  Patient identified by MRN, date of birth, ID band Patient awake    Reviewed: Allergy & Precautions, H&P , NPO status , Patient's Chart, lab work & pertinent test results, reviewed documented beta blocker date and time   Airway Mallampati: II   Neck ROM: full    Dental  (+) Poor Dentition   Pulmonary neg pulmonary ROS, sleep apnea , Current Smoker,    Pulmonary exam normal        Cardiovascular Exercise Tolerance: Good hypertension, On Medications negative cardio ROS Normal cardiovascular exam Rhythm:regular Rate:Normal     Neuro/Psych PSYCHIATRIC DISORDERS  Neuromuscular disease negative neurological ROS  negative psych ROS   GI/Hepatic negative GI ROS, Neg liver ROS,   Endo/Other  negative endocrine ROS  Renal/GU negative Renal ROS  negative genitourinary   Musculoskeletal   Abdominal   Peds  Hematology negative hematology ROS (+)   Anesthesia Other Findings Past Medical History: No date: Bipolar disorder ([place]) No date: Drug abuse ([place]) No date: Dyslipidemia No date: Hemorrhoids No date: Hyperlipidemia No date: Hypertension No date: PTSD (post-traumatic stress disorder) No date: Seasonal allergies No date: Sleep apnea     Comment:  denies sleep apnea No date: Tobacco use Past Surgical History: No date: BACK SURGERY No date: CHOLECYSTECTOMY No date: COLONOSCOPY No date: CYSTECTOMY     Comment:  arms No date: FOOT SURGERY; Right No date: KNEE SURGERY; Right     Comment:  cartilege repair BMI    Body Mass Index:  25.25 kg/m     Reproductive/Obstetrics negative OB ROS                             Anesthesia Physical Anesthesia Plan  ASA: III  Anesthesia Plan: General   Post-op Pain Management:    Induction:   PONV Risk Score and Plan:   Airway Management Planned:   Additional Equipment:   Intra-op Plan:   Post-operative Plan:    Informed Consent: I have reviewed the patients History and Physical, chart, labs and discussed the procedure including the risks, benefits and alternatives for the proposed anesthesia with the patient or authorized representative who has indicated his/her understanding and acceptance.   Dental Advisory Given  Plan Discussed with: CRNA  Anesthesia Plan Comments: (Pos uds amphetamines, marijuana and neg cocaine.  Pt denies cocaine use.  Admits to aderall 3 days ago.  JA )        Anesthesia Quick Evaluation

## 2017-02-21 NOTE — OR Nursing (Signed)
Dr. Cleda Mccreedy in to see patient [phone]

## 2017-02-21 NOTE — Anesthesia Postprocedure Evaluation (Signed)
Anesthesia Post Note  Patient: TOR TSUDA  Procedure(s) Performed: HAMMER TOE CORRECTION X2 left 4th and 5th (Left Foot)  Patient location during evaluation: PACU Anesthesia Type: General Level of consciousness: awake and alert Pain management: pain level controlled Vital Signs Assessment: post-procedure vital signs reviewed and stable Respiratory status: spontaneous breathing, nonlabored ventilation, respiratory function stable and patient connected to nasal cannula oxygen Cardiovascular status: blood pressure returned to baseline and stable Postop Assessment: no apparent nausea or vomiting Anesthetic complications: no     Last Vitals:  Vitals:   02/21/17 0915 02/21/17 0927  BP: (!) 121/93 114/82  Pulse: (!) 59 (!) 57  Resp: 18 18  Temp: (!) 36.1 C (!) 36.1 C  SpO2: 98% 100%    Last Pain:  Vitals:   02/21/17 0927  TempSrc: Temporal  PainSc:                  Molli Barrows

## 2017-02-21 NOTE — Interval H&P Note (Signed)
History and Physical Interval Note:  02/21/2017 7:09 AM  Craig Nolan  has presented today for surgery, with the diagnosis of M20.42, M89.8X9  The various methods of treatment have been discussed with the patient and family. After consideration of risks, benefits and other options for treatment, the patient has consented to  Procedure(s): HAMMER TOE CORRECTION X2 left 4th and 5th (Left) as a surgical intervention .  The patient's history has been reviewed, patient examined, no change in status, stable for surgery.  I have reviewed the patient's chart and labs.  Questions were answered to the patient's satisfaction.     Durward Fortes

## 2017-02-21 NOTE — Op Note (Signed)
Date of operation: 02/21/2017.  Surgeon: Durward Fortes DPM.  Preoperative diagnosis: Hammertoe with exostosis left fourth and fifth toes.  Postoperative diagnosis: Same.  Procedures: Arthroplasty left fourth and fifth toes.  Anesthesia: Local Mac.  Hemostasis: Pneumatic tourniquet left ankle 250 mmHg.  Estimated blood loss: Less than 5 cc.  Pathology: None.  Materials: None.  Complications: None apparent.  Operative indications: This is a 51 year old male with chronic painful hammertoes on his left foot. Patient elects for surgical intervention as previously performed to the toes on the right foot.  Operative procedure: Patient was taken to the operating room and placed on the table in the supine position. Following satisfactory sedation the right foot was anesthetized with 10 cc of 0.5% bupivacaine plain around the fourth and fifth toes. A pneumatic tourniquet was applied at the level of the left ankle and the foot was prepped and draped in usual sterile fashion. The foot was exsanguinated and the tourniquet inflated to 250 mmHg.     Attention was then directed to the dorsal aspect of the left fourth and fifth toes where an approximate 2 cm linear incision was made coursing proximal to distal over both of the toes. The incision was deepened via sharp and blunt dissection down to the level of the joint where a transverse tenotomy was performed and the capsular and periosteal tissue reflected off of the head of the proximal phalanx which was then resected in toto using a sagittal saw. Next the soft tissue was freed from around the medial aspect of the middle phalanx area on the fifth toe and lateral aspect of the middle phalanx on the fourth toe. Using the sagittal saw the lateral eminence of the fourth toe middle phalanx and medial eminence of the fifth toe middle phalanx were then resected and removed in toto. Intraoperative FluoroScan views revealed good reduction of the bony deformities  and alignment. The wounds were flushed with copious amounts of sterile saline and closed using 4-0 Vicryl simple interrupted suture for tendon reapproximation followed by skin closure using 5-0 nylon simple interrupted sutures. Xeroform and sterile gauze bandage applied. Tourniquet was released and blood flow noted to return immediately to all digits on the left foot. Patient tolerated the procedure and anesthesia well and was transported to the PACU with vital signs stable and in good condition.

## 2017-02-21 NOTE — Discharge Instructions (Addendum)
1. Keep the bandage on the left foot clean, dry, and do not remove.  2. Sponge bathe only left lower extremity.  3. Elevate left lower extremity on pillows.  4. Wear surgical shoe on the left foot whenever walking or standing.  5. Take one pain pill, Norco, every 4 hours only if needed for pain.     AMBULATORY SURGERY  DISCHARGE INSTRUCTIONS   1) The drugs that you were given will stay in your system until tomorrow so for the next 24 hours you should not:  A) Drive an automobile B) Make any legal decisions C) Drink any alcoholic beverage   2) You may resume regular meals tomorrow.  Today it is better to start with liquids and gradually work up to solid foods.  You may eat anything you prefer, but it is better to start with liquids, then soup and crackers, and gradually work up to solid foods.   3) Please notify your doctor immediately if you have any unusual bleeding, trouble breathing, redness and pain at the surgery site, drainage, fever, or pain not relieved by medication.    4) Additional Instructions:        Please contact your physician with any problems or Same Day Surgery at [phone], Monday through Friday 6 am to 4 pm, or [place] at [facility] number at [phone].

## 2017-02-26 DIAGNOSIS — M2042 Other hammer toe(s) (acquired), left foot: Secondary | ICD-10-CM | POA: Diagnosis not present

## 2017-03-06 ENCOUNTER — Ambulatory Visit: Payer: Medicare HMO | Admitting: Nurse Practitioner

## 2017-04-08 ENCOUNTER — Ambulatory Visit: Payer: Medicare HMO | Admitting: Family Medicine

## 2017-04-17 ENCOUNTER — Ambulatory Visit (INDEPENDENT_AMBULATORY_CARE_PROVIDER_SITE_OTHER): Payer: Medicare HMO | Admitting: Family Medicine

## 2017-04-17 ENCOUNTER — Encounter: Payer: Self-pay | Admitting: Family Medicine

## 2017-04-17 VITALS — BP 127/82 | HR 76 | Temp 98.3°F | Resp 16 | Ht 72.0 in | Wt 175.0 lb

## 2017-04-17 DIAGNOSIS — J209 Acute bronchitis, unspecified: Secondary | ICD-10-CM | POA: Diagnosis not present

## 2017-04-17 DIAGNOSIS — L729 Follicular cyst of the skin and subcutaneous tissue, unspecified: Secondary | ICD-10-CM

## 2017-04-17 DIAGNOSIS — G933 Postviral fatigue syndrome: Secondary | ICD-10-CM | POA: Diagnosis not present

## 2017-04-17 DIAGNOSIS — G9331 Postviral fatigue syndrome: Secondary | ICD-10-CM

## 2017-04-17 DIAGNOSIS — L089 Local infection of the skin and subcutaneous tissue, unspecified: Secondary | ICD-10-CM

## 2017-04-17 MED ORDER — BENZONATATE 100 MG PO CAPS: 100.0000 mg | ORAL_CAPSULE | Freq: Three times a day (TID) | ORAL | 0 refills | Status: DC | PRN

## 2017-04-17 MED ORDER — AMOXICILLIN-POT CLAVULANATE 875-125 MG PO TABS
1.0000 | ORAL_TABLET | Freq: Two times a day (BID) | ORAL | 0 refills | Status: DC
Start: 2017-04-17 — End: 2017-10-31

## 2017-04-17 NOTE — Patient Instructions (Addendum)
Thank you for coming to the office today.  1.  You may have had the Flu - it has been >1-2 weeks and no longer in window to treat you since it has been too long  We can cover for possible complication or bronchitis with Augmentin antibiotic if too costly then can notify us and we can switch to Amoxicillin  - Wash hands and cover cough very well to avoid spread of infection - For symptom control:      - Take Ibuprofen / Advil 400-600mg  every 6-8 hours as needed for fever / muscle aches, and may also take Tylenol 500-1000mg  per dose every 6-8 hours or 3 times a day, can alternate dosing      - Start Tessalon perls one every 8 hours or 3 times a day as needed for cough      - Start OTC Mucinex-DM for cough and congestion for up to 7 days - Improve hydration with plenty of clear fluids  If significant worsening with poor fluid intake, worsening fever, difficulty breathing due to coughing, worsening body aches, weakness, or other more concerning symptoms difficulty breathing you can seek treatment at Emergency Department. Also if improved flu symptoms and then worsening days to week later with concerns for bronchitis, productive cough fever chills again we may need to check for possible pneumonia that can occur after the flu   3.  GENERAL SURGERY - when ready to have cyst on back removed - call them or let us know you need a referral  [facility] Location [address] [place], [place] [address] Hours: 8:30am to 5pm (M-F) Ph [phone]   Please schedule a Follow-up Appointment to: Return in about 1 week (around 04/24/2017), or if symptoms worsen or fail to improve, for Bronchitis vs Flu.    If you have any other questions or concerns, please feel free to call the office or send a message through [facility]. You may also schedule an earlier appointment if necessary.  Additionally, you may be receiving a survey about your experience at our office within a few  days to 1 week by e-mail or mail. We value your feedback.  Nobie Putnam, DO [place]

## 2017-04-17 NOTE — Progress Notes (Signed)
Subjective:    Patient ID: Craig Nolan, male    DOB: October 09, 1965, 52 y.o.   MRN: [id number]  Craig Nolan is a 52 y.o. male presenting on 04/17/2017 for Cold Exposure (chills fever cough onset 2 weeks) and Cyst (on back infected )  Patient presents for a same day appointment.  HPI   Flu-like Symptoms / Bronchitis Reports symptoms of URI flu-like illness persistent for 2 weeks, did not get tested for flu in past, he did not seek care, he did get flu shot this season. Now still having cough and congestion symptoms. - Taking Vitamin C, MVI, DayQuil and Ibuprofen - Admits fevers and chills, body aches muscle aches, reduced energy - He is more fatigued and tired, sleeping constantly - Denies any nausea vomiting abdominal pain, diarrhea  Cyst on Back Upper back with cyst like area he reports has been there for long time, now worsening with inc size swelling and drainage of purulent material, some tender. No spreading redness Requesting removal of cyst  Health Maintenance: UTD on Flu Shot 01/2017  Depression screen [facility] 2/9 02/04/2017 05/17/2016 07/17/2015  Decreased Interest 0 0 3  Down, Depressed, Hopeless 0 0 1  PHQ - 2 Score 0 0 4  Altered sleeping - - 3  Tired, decreased energy - - 3  Change in appetite - - 3  Feeling bad or failure about yourself  - - 3  Trouble concentrating - - 3  Moving slowly or fidgety/restless - - 3  Suicidal thoughts - - 0  PHQ-9 Score - - 22  Difficult doing work/chores - - Somewhat difficult    Social History   Tobacco Use  . Smoking status: Current Every Day Smoker    Packs/day: 1.00    Years: 20.00    Pack years: 20.00    Types: Cigarettes  . Smokeless tobacco: Never Used  Substance Use Topics  . Alcohol use: No    Alcohol/week: 0.0 oz    Comment: stopped 3 years  . Drug use: No    Comment: stopped 15 years ago     Review of Systems Per HPI unless specifically indicated above     Objective:    BP 127/82   Pulse 76   Temp  98.3 F (36.8 C) (Oral)   Resp 16   Ht 6' (1.829 m)   Wt 175 lb (79.4 kg)   SpO2 100%   BMI 23.73 kg/m   Wt Readings from Last 3 Encounters:  04/17/17 175 lb (79.4 kg)  02/21/17 171 lb (77.6 kg)  02/19/17 171 lb (77.6 kg)    Physical Exam  Constitutional: He is oriented to person, place, and time. He appears well-developed and well-nourished. No distress.  Tired and mildly ill appearing, uncomfortable, cooperative  HENT:  Head: Normocephalic and atraumatic.  Mouth/Throat: Oropharynx is clear and moist.  Frontal / maxillary sinuses non-tender. Nares patent without purulence or edema. Bilateral TMs obscured by cerumen, some partial view without erythema, effusion or bulging. Oropharynx post nasal drainage without erythema, exudates, edema or asymmetry.  Eyes: Conjunctivae are normal. Right eye exhibits no discharge. Left eye exhibits no discharge.  Neck: Normal range of motion. Neck supple.  Cardiovascular: Normal rate, regular rhythm, normal heart sounds and intact distal pulses.  No murmur heard. Pulmonary/Chest: Effort normal. No respiratory distress.  Bibasilar mild coarse breath sounds, with scattered wheeze.  Musculoskeletal: Normal range of motion. He exhibits no edema.  Lymphadenopathy:    He has no cervical adenopathy.  Neurological: He is alert and oriented to person, place, and time.  Skin: Skin is warm and dry. No rash noted. He is not diaphoretic. No erythema.  Psychiatric: His behavior is normal.  Nursing note and vitals reviewed.  Results for orders placed or performed during the hospital encounter of 02/21/17  Urine Drug Screen, Qualitative ([facility] only)  Result Value Ref Range   Tricyclic, Ur Screen NONE DETECTED NONE DETECTED   Amphetamines, Ur Screen POSITIVE (A) NONE DETECTED   MDMA (Ecstasy)Ur Screen NONE DETECTED NONE DETECTED   Cocaine Metabolite,Ur Rolfe NONE DETECTED NONE DETECTED   Opiate, Ur Screen NONE DETECTED NONE DETECTED   Phencyclidine (PCP) Ur S  NONE DETECTED NONE DETECTED   Cannabinoid 50 Ng, Ur [place] POSITIVE (A) NONE DETECTED   Barbiturates, Ur Screen NONE DETECTED NONE DETECTED   Benzodiazepine, Ur Scrn NONE DETECTED NONE DETECTED   Methadone Scn, Ur NONE DETECTED NONE DETECTED      Assessment & Plan:   Problem List Items Addressed This Visit    None    Visit Diagnoses    Infected cyst of skin    -  Primary Coverage with augmentin to resolve acute infection Seems mostly drained on own, still likely underlying cyst material bothering patient Advised can refer to Gen Surg when ready, he will notify us also given handout of [facility] location    Relevant Medications   amoxicillin-clavulanate (AUGMENTIN) 875-125 MG tablet   Post-influenza syndrome       Relevant Medications   benzonatate (TESSALON) 100 MG capsule   Acute bronchitis, unspecified organism       Relevant Medications   amoxicillin-clavulanate (AUGMENTIN) 875-125 MG tablet      Clinically consistent with influenza, but onset >1-2 week ago, now persistent symptoms, concern possible bronchitis or secondary infection - Duration x 1-2 weeks. Tolerating PO and well hydrated - S/p influenza vaccine this season  Plan: 1. Start antibiotic for coverage of infected cyst and lower respiratory tract - Augmentin BID x 10 days - if cost is too much will consider Amoxicillin $4 list 2. Supportive care as advised with NSAID / Tylenol PRN fever/myalgias, improve hydration, may take OTC Cold/Flu meds - Start Tessalon Perls take 1 capsule up to 3 times a day as needed for cough Return criteria given if significant worsening, consider post-influenza complications, otherwise follow-up if needed   Meds ordered this encounter  Medications  . amoxicillin-clavulanate (AUGMENTIN) 875-125 MG tablet    Sig: Take 1 tablet by mouth 2 (two) times daily.    Dispense:  20 tablet    Refill:  0  . benzonatate (TESSALON) 100 MG capsule    Sig: Take 1 capsule (100 mg total) by mouth 3 (three)  times daily as needed for cough.    Dispense:  30 capsule    Refill:  0      Follow up plan: Return in about 1 week (around 04/24/2017), or if symptoms worsen or fail to improve, for Bronchitis vs Flu.  Nobie Putnam, [facility] 04/17/2017, 4:06 PM

## 2017-10-31 ENCOUNTER — Ambulatory Visit (INDEPENDENT_AMBULATORY_CARE_PROVIDER_SITE_OTHER): Payer: Medicare HMO | Admitting: Family Medicine

## 2017-10-31 ENCOUNTER — Encounter: Payer: Self-pay | Admitting: Family Medicine

## 2017-10-31 VITALS — BP 124/74 | HR 74 | Temp 98.5°F | Resp 16 | Ht 72.0 in | Wt 172.0 lb

## 2017-10-31 DIAGNOSIS — Z1211 Encounter for screening for malignant neoplasm of colon: Secondary | ICD-10-CM | POA: Diagnosis not present

## 2017-10-31 DIAGNOSIS — H6121 Impacted cerumen, right ear: Secondary | ICD-10-CM | POA: Diagnosis not present

## 2017-10-31 DIAGNOSIS — N529 Male erectile dysfunction, unspecified: Secondary | ICD-10-CM | POA: Diagnosis not present

## 2017-10-31 DIAGNOSIS — K219 Gastro-esophageal reflux disease without esophagitis: Secondary | ICD-10-CM

## 2017-10-31 MED ORDER — OMEPRAZOLE 20 MG PO CPDR: 20.0000 mg | DELAYED_RELEASE_CAPSULE | Freq: Every day | ORAL | 1 refills | Status: DC

## 2017-10-31 MED ORDER — SILDENAFIL CITRATE 20 MG PO TABS: ORAL_TABLET | ORAL | 1 refills | Status: DC

## 2017-10-31 NOTE — Patient Instructions (Addendum)
Thank you for coming to the office today.  Ear wax removed today  Printed rx Sildenafil take to  [facility]   Address: [address], [place], [place] [address] Hours: 8:30AM-6:30PM Phone: [phone]  -------- Referral sent to GI stay tuned for apt  [facility] [place], [place] [address] Phone: [phone]  Colon Cancer Screening: - For all adults age 54+ routine colon cancer screening is highly recommended.     - Recent guidelines from [place] recommend starting age of 67 - Early detection of colon cancer is important, because often there are no warning signs or symptoms, also if found early usually it can be cured. Late stage is hard to treat.  - If you are not interested in Colonoscopy screening (if done and normal you could be cleared for 5 to 10 years until next due), then Cologuard is an excellent alternative for screening test for Colon Cancer. It is highly sensitive for detecting DNA of colon cancer from even the earliest stages. Also, there is NO bowel prep required. - If Cologuard is NEGATIVE, then it is good for 3 years before next due - If Cologuard is POSITIVE, then it is strongly advised to get a Colonoscopy, which allows the GI doctor to locate the source of the cancer or polyp (even very early stage) and treat it by removing it.  -------------------------------------  DUE for FASTING BLOOD WORK (no food or drink after midnight before the lab appointment, only water or coffee without cream/sugar on the morning of)  SCHEDULE "Lab Only" visit in the morning at the clinic for lab draw in 3 MONTHS   - Make sure Lab Only appointment is at about 1 week before your next appointment, so that results will be available  For Lab Results, once available within 2-3 days of blood draw, you can can log in to MyChart online to view your results and a brief explanation. Also, we can discuss  results at next follow-up visit.   Please schedule a Follow-up Appointment to: Return in about 3 months (around 01/31/2018) for Annual Physical.  If you have any other questions or concerns, please feel free to call the office or send a message through [place]. You may also schedule an earlier appointment if necessary.  Additionally, you may be receiving a survey about your experience at our office within a few days to 1 week by e-mail or mail. We value your feedback.  Nobie Putnam, DO [place]

## 2017-10-31 NOTE — Progress Notes (Signed)
Subjective:    Patient ID: Craig Nolan, male    DOB: [birth year], 52 y.o.   MRN: [id number]  Craig Nolan is a 52 y.o. male presenting on 10/31/2017 for Gastroesophageal Reflux (needs refill) and Cerumen Impaction (right side)   HPI   GERD Chronic problem, he used to take Ranitidine 150mg  x 2 for 300mg  daily with decent results, seems to have had flare of GERD that is worsening, refractory, some dietary triggers, no other complicating symptoms - Interested in PPI medicine - Denies abdominal pain, nausea vomiting regurgitation  R Ear Cerumen Impaction / Reduced Hearing Reports gradual worsening problem for while now with "clogged" R ear due to wax and he has difficulty hearing now, without significant pain or difficulty but requesting ear flush. He has had this before. - Denies headache, sinus pain or pressure congestion  Erectile Dysfunction Improved in past with Sildenafil generic up to 5 pills, he requested a large amt to be filled at local pharmacy for discount rate, requesting printed rx for this med.  Health Maintenance:  Colon CA Screening: Never had colonoscopy. Currently asymptomatic. Known family history of colon CA, father age 36-60s. Due for screening test new referral to GI for colonoscopy   Depression screen [facility] 2/9 10/31/2017 02/04/2017 05/17/2016  Decreased Interest 0 0 0  Down, Depressed, Hopeless 0 0 0  PHQ - 2 Score 0 0 0  Altered sleeping - - -  Tired, decreased energy - - -  Change in appetite - - -  Feeling bad or failure about yourself  - - -  Trouble concentrating - - -  Moving slowly or fidgety/restless - - -  Suicidal thoughts - - -  PHQ-9 Score - - -  Difficult doing work/chores - - -    Social History   Tobacco Use  . Smoking status: Current Every Day Smoker    Packs/day: 1.00    Years: 20.00    Pack years: 20.00    Types: Cigarettes  . Smokeless tobacco: Current User  Substance Use Topics  . Alcohol use: No    Alcohol/week: 0.0  standard drinks    Comment: stopped 3 years  . Drug use: No    Types: Cocaine, Marijuana    Comment: stopped 15 years ago     Review of Systems Per HPI unless specifically indicated above     Objective:    BP 124/74   Pulse 74   Temp 98.5 F (36.9 C) (Oral)   Resp 16   Ht 6' (1.829 m)   Wt 172 lb (78 kg)   BMI 23.33 kg/m   Wt Readings from Last 3 Encounters:  10/31/17 172 lb (78 kg)  04/17/17 175 lb (79.4 kg)  02/21/17 171 lb (77.6 kg)    Physical Exam  Constitutional: He is oriented to person, place, and time. He appears well-developed and well-nourished. No distress.  Well-appearing, comfortable, cooperative  HENT:  Head: Normocephalic and atraumatic.  Mouth/Throat: Oropharynx is clear and moist.  Frontal / maxillary sinuses non-tender. Nares patent without purulence or edema.  R TM obscured by impacted dry cerumen., L TM normal, clear canal.  Oropharynx clear without erythema, exudates, edema or asymmetry.  Eyes: Conjunctivae are normal. Right eye exhibits no discharge. Left eye exhibits no discharge.  Cardiovascular: Normal rate.  Pulmonary/Chest: Effort normal.  Musculoskeletal: He exhibits no edema.  Neurological: He is alert and oriented to person, place, and time.  Skin: Skin is warm and dry. No rash noted. He  is not diaphoretic. No erythema.  Psychiatric: He has a normal mood and affect. His behavior is normal.  Well groomed, good eye contact, normal speech and thoughts  Nursing note and vitals reviewed.  ________________________________________________________ PROCEDURE NOTE Date: 10/31/17 Right Ear Lavage / Cerumen Removal Discussed benefits and risks (including pain / discomforts, dizziness, minor abrasion of ear canal). Verbal consent given by patient. Medication:  carbamide peroxide ear drops, Ear Lavage Solution (warm water + hydrogen peroxide) Performed by Frederich Cha, CMA Several drops of carbamide peroxide placed in each ear, allowed to sit  for few minutes. Ear lavage solution flushed into one R ear in attempt to dislodge and remove ear wax. Results were successful with removed impacted cerumen.  Repeat Ear Exam: - Completely removed cerumen now, with clear ear canals and visible TMs clear and normal appearance.       Assessment & Plan:   Problem List Items Addressed This Visit    Erectile dysfunction - Primary Controlled on PDE Sildenafil Refill today, printed, #100 by request, refills, take up to 5 per dose    Relevant Medications   sildenafil (REVATIO) 20 MG tablet    Other Visit Diagnoses    Gastroesophageal reflux disease, esophagitis presence not specified     Refractory symptoms on H2blocker high dose Switch to PPI Omeprazole 20mg  daily, rx today    Relevant Medications   omeprazole (PRILOSEC) 20 MG capsule   Screen for colon cancer     Referral to [facility] for scheduling initial screening colonoscopy, has fam history    Relevant Orders   Ambulatory referral to Gastroenterology   Impacted cerumen of right ear       Hearing loss of right ear due to cerumen impaction      Significant amount of large thick impacted cerumen Right ear, suspected primary cause of current reduced bilateral hearing.  Plan: 1. Successful office ear lavage cerumen removal today, re-evaluated with clear ear canals and normal TMs 2. Counseled on avoiding Q-tips and may use Kleenex as wick, use OTC Debrox as needed 3. Follow-up as needed       Meds ordered this encounter  Medications  . sildenafil (REVATIO) 20 MG tablet    Sig: Take up to 5 pills about 30 min prior to sex    Dispense:  100 tablet    Refill:  1  . omeprazole (PRILOSEC) 20 MG capsule    Sig: Take 1 capsule (20 mg total) by mouth daily before breakfast.    Dispense:  90 capsule    Refill:  1   Orders Placed This Encounter  Procedures  . Ambulatory referral to Gastroenterology    Referral Priority:   Routine    Referral Type:   Consultation    Referral Reason:    Specialty Services Required    Number of Visits Requested:   1    Follow up plan: Return in about 3 months (around 01/31/2018) for Annual Physical.  Future labs ordered for 01/19/18  Nobie Putnam, [facility] 10/31/2017, 3:14 PM

## 2017-11-01 ENCOUNTER — Other Ambulatory Visit: Payer: Self-pay | Admitting: Family Medicine

## 2017-11-01 DIAGNOSIS — N529 Male erectile dysfunction, unspecified: Secondary | ICD-10-CM

## 2017-11-01 DIAGNOSIS — F99 Mental disorder, not otherwise specified: Secondary | ICD-10-CM

## 2017-11-01 DIAGNOSIS — E782 Mixed hyperlipidemia: Secondary | ICD-10-CM

## 2017-11-01 DIAGNOSIS — F5105 Insomnia due to other mental disorder: Secondary | ICD-10-CM

## 2017-11-01 DIAGNOSIS — Z0000 Encounter for general adult medical examination without abnormal findings: Secondary | ICD-10-CM

## 2017-11-01 DIAGNOSIS — F3111 Bipolar disorder, current episode manic without psychotic features, mild: Secondary | ICD-10-CM

## 2017-11-01 DIAGNOSIS — G894 Chronic pain syndrome: Secondary | ICD-10-CM

## 2017-11-01 DIAGNOSIS — Z125 Encounter for screening for malignant neoplasm of prostate: Secondary | ICD-10-CM

## 2017-11-17 ENCOUNTER — Other Ambulatory Visit: Payer: Self-pay

## 2017-11-17 DIAGNOSIS — Z1211 Encounter for screening for malignant neoplasm of colon: Secondary | ICD-10-CM

## 2017-11-24 ENCOUNTER — Telehealth: Payer: Self-pay | Admitting: Gastroenterology

## 2017-11-24 DIAGNOSIS — Z1211 Encounter for screening for malignant neoplasm of colon: Secondary | ICD-10-CM

## 2017-11-24 NOTE — Telephone Encounter (Signed)
Colonoscopy has been canceled.  Patient prefers to have Cologuard test.  He's not comfortable with the colonoscopy process.  I've informed him that I will let his PCP know that he would like to have Cologuard instead. Explained to him that we do not do Cologuard testing as well, and if it comes back positive a colonoscopy would be ordered.  Thanks [employer]

## 2017-11-24 NOTE — Addendum Note (Signed)
Addended by: Cleaster Corin on: 11/24/2017 04:45 PM   Modules accepted: Orders

## 2017-11-24 NOTE — Telephone Encounter (Signed)
Pt left vm he has procedure 11/25/17 and he thinks he will just go with colorguard he wants a call  To discuss this

## 2017-11-24 NOTE — Telephone Encounter (Signed)
I have ordered cologuard test for the patient.  Thank you for completing this consultation.  We will complete followup with patient.

## 2017-11-25 ENCOUNTER — Ambulatory Visit: Admit: 2017-11-25 | Payer: Medicare HMO | Admitting: Gastroenterology

## 2017-11-25 SURGERY — COLONOSCOPY WITH PROPOFOL
Anesthesia: General

## 2018-01-19 ENCOUNTER — Other Ambulatory Visit: Payer: Medicare HMO

## 2018-01-22 DIAGNOSIS — Z0000 Encounter for general adult medical examination without abnormal findings: Secondary | ICD-10-CM | POA: Diagnosis not present

## 2018-01-22 DIAGNOSIS — E782 Mixed hyperlipidemia: Secondary | ICD-10-CM | POA: Diagnosis not present

## 2018-01-22 DIAGNOSIS — G894 Chronic pain syndrome: Secondary | ICD-10-CM | POA: Diagnosis not present

## 2018-01-22 DIAGNOSIS — N529 Male erectile dysfunction, unspecified: Secondary | ICD-10-CM | POA: Diagnosis not present

## 2018-01-22 DIAGNOSIS — Z125 Encounter for screening for malignant neoplasm of prostate: Secondary | ICD-10-CM | POA: Diagnosis not present

## 2018-01-22 DIAGNOSIS — F3111 Bipolar disorder, current episode manic without psychotic features, mild: Secondary | ICD-10-CM | POA: Diagnosis not present

## 2018-01-23 LAB — COMPLETE METABOLIC PANEL WITH GFR
AG Ratio: 1.4 (calc) (ref 1.0–2.5)
ALBUMIN MSPROF: 3.5 g/dL — AB (ref 3.6–5.1)
ALT: 26 U/L (ref 9–46)
AST: 21 U/L (ref 10–35)
Alkaline phosphatase (APISO): 91 U/L (ref 40–115)
BILIRUBIN TOTAL: 0.3 mg/dL (ref 0.2–1.2)
BUN: 10 mg/dL (ref 7–25)
CO2: 31 mmol/L (ref 20–32)
CREATININE: 0.73 mg/dL (ref 0.70–1.33)
Calcium: 8.6 mg/dL (ref 8.6–10.3)
Chloride: 105 mmol/L (ref 98–110)
GFR, EST AFRICAN AMERICAN: 122 mL/min/{1.73_m2} (ref >=60)
GFR, EST NON AFRICAN AMERICAN: 105 mL/min/{1.73_m2} (ref >=60)
GLUCOSE: 90 mg/dL (ref 65–99)
Globulin: 2.5 g/dL (calc) (ref 1.9–3.7)
Potassium: 4.4 mmol/L (ref 3.5–5.3)
Sodium: 142 mmol/L (ref 135–146)
Total Protein: 6 g/dL — ABNORMAL LOW (ref 6.1–8.1)

## 2018-01-23 LAB — CBC WITH DIFFERENTIAL/PLATELET
BASOS ABS: 110 {cells}/uL (ref 0–200)
BASOS PCT: 1.6 %
EOS ABS: 255 {cells}/uL (ref 15–500)
Eosinophils Relative: 3.7 %
HCT: 40.8 % (ref 38.5–50.0)
HEMOGLOBIN: 13.5 g/dL (ref 13.2–17.1)
Lymphs Abs: 2498 cells/uL (ref 850–3900)
MCH: 29.7 pg (ref 27.0–33.0)
MCHC: 33.1 g/dL (ref 32.0–36.0)
MCV: 89.7 fL (ref 80.0–100.0)
MPV: 9.7 fL (ref 7.5–12.5)
Monocytes Relative: 8.5 %
Neutro Abs: 3450 cells/uL (ref 1500–7800)
Neutrophils Relative %: 50 %
PLATELETS: 409 10*3/uL — AB (ref 140–400)
RBC: 4.55 10*6/uL (ref 4.20–5.80)
RDW: 12.5 % (ref 11.0–15.0)
TOTAL LYMPHOCYTE: 36.2 %
WBC: 6.9 10*3/uL (ref 3.8–10.8)
WBCMIX: 587 {cells}/uL (ref 200–950)

## 2018-01-23 LAB — LIPID PANEL
CHOL/HDL RATIO: 4 (calc) (ref <5.0)
Cholesterol: 211 mg/dL — ABNORMAL HIGH (ref <200)
HDL: 53 mg/dL (ref >40)
LDL CHOLESTEROL (CALC): 136 mg/dL — AB
Non-HDL Cholesterol (Calc): 158 mg/dL (calc) — ABNORMAL HIGH (ref <130)
Triglycerides: 117 mg/dL (ref <150)

## 2018-01-23 LAB — HEMOGLOBIN A1C
EAG (MMOL/L): 6.2 (calc)
HEMOGLOBIN A1C: 5.5 %{Hb} (ref <5.7)
Mean Plasma Glucose: 111 (calc)

## 2018-01-23 LAB — PSA: PSA: 1.1 ng/mL (ref <=4.0)

## 2018-01-26 ENCOUNTER — Encounter: Payer: Medicare HMO | Admitting: Family Medicine

## 2018-02-24 ENCOUNTER — Ambulatory Visit: Payer: Medicare HMO | Admitting: Family Medicine

## 2018-02-26 ENCOUNTER — Ambulatory Visit: Payer: Medicare HMO | Admitting: Family Medicine

## 2018-03-10 DIAGNOSIS — J410 Simple chronic bronchitis: Secondary | ICD-10-CM | POA: Insufficient documentation

## 2018-05-12 ENCOUNTER — Emergency Department
Admission: EM | Admit: 2018-05-12 | Discharge: 2018-05-12 | Disposition: A | Discharge status: Home / Self Care | Payer: Medicare Other | Attending: Emergency Medicine | Admitting: Emergency Medicine

## 2018-05-12 ENCOUNTER — Emergency Department: Payer: Medicare Other

## 2018-05-12 ENCOUNTER — Encounter: Payer: Self-pay | Admitting: Emergency Medicine

## 2018-05-12 ENCOUNTER — Other Ambulatory Visit: Payer: Self-pay

## 2018-05-12 DIAGNOSIS — S46911A Strain of unspecified muscle, fascia and tendon at shoulder and upper arm level, right arm, initial encounter: Secondary | ICD-10-CM

## 2018-05-12 DIAGNOSIS — F17210 Nicotine dependence, cigarettes, uncomplicated: Secondary | ICD-10-CM | POA: Diagnosis not present

## 2018-05-12 DIAGNOSIS — Z9049 Acquired absence of other specified parts of digestive tract: Secondary | ICD-10-CM | POA: Diagnosis not present

## 2018-05-12 DIAGNOSIS — F319 Bipolar disorder, unspecified: Secondary | ICD-10-CM | POA: Insufficient documentation

## 2018-05-12 DIAGNOSIS — X500XXA Overexertion from strenuous movement or load, initial encounter: Secondary | ICD-10-CM | POA: Diagnosis not present

## 2018-05-12 DIAGNOSIS — I10 Essential (primary) hypertension: Secondary | ICD-10-CM | POA: Diagnosis not present

## 2018-05-12 DIAGNOSIS — Y9389 Activity, other specified: Secondary | ICD-10-CM | POA: Diagnosis not present

## 2018-05-12 DIAGNOSIS — Z79899 Other long term (current) drug therapy: Secondary | ICD-10-CM | POA: Diagnosis not present

## 2018-05-12 DIAGNOSIS — F1290 Cannabis use, unspecified, uncomplicated: Secondary | ICD-10-CM | POA: Diagnosis not present

## 2018-05-12 DIAGNOSIS — Y929 Unspecified place or not applicable: Secondary | ICD-10-CM | POA: Insufficient documentation

## 2018-05-12 DIAGNOSIS — S4991XA Unspecified injury of right shoulder and upper arm, initial encounter: Secondary | ICD-10-CM | POA: Diagnosis present

## 2018-05-12 DIAGNOSIS — Y998 Other external cause status: Secondary | ICD-10-CM | POA: Insufficient documentation

## 2018-05-12 MED ORDER — LIDOCAINE 5 % EX PTCH
1.0000 | MEDICATED_PATCH | CUTANEOUS | Status: DC
  Administered 2018-05-12: 1 via TRANSDERMAL
  Filled 2018-05-12: qty 1

## 2018-05-12 MED ORDER — TRAMADOL HCL 50 MG PO TABS: 50.0000 mg | ORAL_TABLET | Freq: Four times a day (QID) | ORAL | 0 refills | Status: AC | PRN

## 2018-05-12 MED ORDER — IBUPROFEN 600 MG PO TABS: 600.0000 mg | ORAL_TABLET | Freq: Three times a day (TID) | ORAL | 0 refills | Status: DC | PRN

## 2018-05-12 MED ORDER — CYCLOBENZAPRINE HCL 10 MG PO TABS: 10.0000 mg | ORAL_TABLET | Freq: Three times a day (TID) | ORAL | 0 refills | Status: DC | PRN

## 2018-05-12 NOTE — ED Notes (Signed)
See triage note  Presents with pain from under right shoulder blade and moving into right arm  States hit lifted a wood stove couple of days ago    Limited ROM  Area is tender to touch  Good pulses

## 2018-05-12 NOTE — Discharge Instructions (Addendum)
Follow discharge care instruction take medication as directed.  Follow-up with PCP for continued care.

## 2018-05-12 NOTE — ED Provider Notes (Signed)
[facility] Emergency Department Provider Note   ____________________________________________   First MD Initiated Contact with Patient 05/12/18 [phone]     (approximate)  I have reviewed the triage vital signs and the nursing notes.   HISTORY  Chief Complaint Arm Injury    HPI Craig Nolan is a 53 y.o. male patient complain right shoulder and scapular pain secondary to lifting incident 2 days ago.  Patient did pain started after lifting a wooden stove.  Patient states there is decreased range of motion with overhead reaching and abduction of the right upper extremity.  Patient also complained of transient numbness to the fingertips.  Patient went to urgent care clinic and was sent to ED for definitive evaluation and treatment.  Patient is right-hand dominant.  Patient rates his pain as a 10/10.  Patient described the pain as "sharp/achy".  No palliative measure for complaint.         Past Medical History:  Diagnosis Date  . Bipolar disorder ([place])   . Drug abuse ([place])   . Dyslipidemia   . Hemorrhoids   . Hyperlipidemia   . Hypertension   . PTSD (post-traumatic stress disorder)   . Seasonal allergies   . Sleep apnea    denies sleep apnea  . Tobacco use     Patient Active Problem List   Diagnosis Date Noted  . 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 Erectile dysfunction 10/14/2016  . Fatigue 11/02/2015  . Chronic low back pain (Location of Secondary source of pain) (Bilateral) (L>R) 05/25/2015  . 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 Lumbar spondylosis 05/25/2015  . Chronic pain 05/25/2015  . Musculoskeletal pain 05/25/2015  . Neurogenic pain 05/25/2015  . Neuropathic pain 05/25/2015  . Long term prescription benzodiazepine use 05/25/2015  . Encounter for therapeutic drug level monitoring 05/25/2015  . Chronic lower extremity pain (Location of Primary Source of Pain) (Left) 05/25/2015  . Chronic  lumbar radicular pain 05/25/2015  . Marijuana use 05/25/2015  . Cocaine use 05/25/2015  . Illicit drug use 97/35/3299  . Acute back pain with sciatica 05/25/2015  . Insomnia 05/25/2015  . Chronic constipation 05/25/2015  . 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 Chronic hip pain (Location of Tertiary source of pain) (Bilateral) (L>R) 05/25/2015  . Bipolar disorder ([place]) 05/25/2015  . Sciatica 03/30/2015  . Hyperlipidemia   . Tobacco use     Past Surgical History:  Procedure Laterality Date  . BACK SURGERY    . CHOLECYSTECTOMY    . COLONOSCOPY    . CYSTECTOMY     arms  . FOOT SURGERY Right   . HAMMER TOE SURGERY Left 02/21/2017   Procedure: HAMMER TOE CORRECTION X2 left 4th and 5th;  Surgeon: Sharlotte Alamo, DPM;  Location: [facility] ORS;  Service: Podiatry;  Laterality: Left;  . KNEE SURGERY Right    cartilege repair    Prior to Admission medications   Medication Sig Start Date End Date Taking? Authorizing Provider  cyclobenzaprine (FLEXERIL) 10 MG tablet Take 1 tablet (10 mg total) by mouth 3 (three) times daily as needed. 05/12/18   Sable Feil, PA-C  ibuprofen (ADVIL,MOTRIN) 600 MG tablet Take 1 tablet (600 mg total) by mouth every 8 (eight) hours as needed. 05/12/18   Sable Feil, PA-C  Multiple Vitamins-Minerals (CENTRUM SILVER ADULT 50+ PO) Take 1 tablet by mouth daily.     [provider]  sildenafil (REVATIO) 20 MG tablet Take up  to 5 pills about 30 min prior to sex 10/31/17   Olin Hauser, DO  traMADol (ULTRAM) 50 MG tablet Take 1 tablet (50 mg total) by mouth every 6 (six) hours as needed. 05/12/18 05/12/19  Sable Feil, PA-C    Allergies Hydrocodone-acetaminophen  Family History  Problem Relation Age of Onset  . Diabetes Mother   . Cancer Father   . Stroke Father     Social History Social History   Tobacco Use  . Smoking status: Current Every Day Smoker    Packs/day: 1.00    Years: 20.00    Pack years: 20.00    Types:  Cigarettes  . Smokeless tobacco: Current User  Substance Use Topics  . Alcohol use: Not Currently    Alcohol/week: 0.0 standard drinks  . Drug use: Not Currently    Types: Cocaine, Marijuana    Review of Systems Constitutional: No fever/chills Eyes: No visual changes. ENT: No sore throat. Cardiovascular: Denies chest pain. Respiratory: Denies shortness of breath. Gastrointestinal: No abdominal pain.  No nausea, no vomiting.  No diarrhea.  No constipation. Genitourinary: Negative for dysuria. Musculoskeletal: Shoulder pain.. Skin: Negative for rash. Neurological: Negative for headaches, focal weakness or numbness. Psychiatric:  Bipolar and PTSD. Endocrine:  Hyperlipidemia and hypertension. Allergic/Immunilogical: Hydrocodone and Tylenol. ____________________________________________   PHYSICAL EXAM:  VITAL SIGNS: ED Triage Vitals  Enc Vitals Group     BP 05/12/18 0818 (!) 127/91     Pulse Rate 05/12/18 0818 81     Resp --      Temp 05/12/18 0818 98.4 F (36.9 C)     Temp Source 05/12/18 0818 Oral     SpO2 05/12/18 0818 100 %     Weight 05/12/18 0814 178 lb (80.7 kg)     Height 05/12/18 0814 5\' 9"  (1.753 m)     Head Circumference --      Peak Flow --      Pain Score 05/12/18 0813 10     Pain Loc --      Pain Edu? --      Excl. in [place]? --    Constitutional: Alert and oriented.  Moderate distress. Neck: No stridor.  No cervical spine tenderness to palpation. Hematological/Lymphatic/Immunilogical: No cervical lymphadenopathy. Cardiovascular: Normal rate, regular rhythm. Grossly normal heart sounds.  Good peripheral circulation. Respiratory: Normal respiratory effort.  No retractions. Lungs CTAB. Musculoskeletal: No obvious deformity to the right upper extremity.  Patient is moderate guarding palpation of medial mid scapulary muscle area.  Patient has decreased range of motion noted by complaint of pain with adduction of region.   Neurologic:  Normal speech and language.  No gross focal neurologic deficits are appreciated. No gait instability. Skin:  Skin is warm, dry and intact. No rash noted. Psychiatric: Mood and affect are normal. Speech and behavior are normal.  ____________________________________________   LABS (all labs ordered are listed, but only abnormal results are displayed)  Labs Reviewed - No data to display ____________________________________________  EKG   ____________________________________________  RADIOLOGY  ED MD interpretation:    Official radiology report(s): Dg Shoulder Right  Result Date: 05/12/2018 CLINICAL DATA:  Right shoulder and scapular pain after lifting injury 2 days prior EXAM: RIGHT SHOULDER - 2+ VIEW COMPARISON:  None. FINDINGS: There is no evidence of fracture or dislocation. There is no evidence of arthropathy or other focal bone abnormality. Soft tissues are unremarkable. IMPRESSION: No right shoulder fracture or malalignment. Electronically Signed   By: Janina Mayo.D.  On: 05/12/2018 09:25    ____________________________________________   PROCEDURES  Procedure(s) performed (including Critical Care):  Procedures   ____________________________________________   INITIAL IMPRESSION / ASSESSMENT AND PLAN / ED COURSE  As part of my medical decision making, I reviewed the following data within the [place]         Right shoulder pain secondary to scapular muscle strain.  Discussed x-ray findings with patient.  Patient given discharge care instruction advised take medication as directed.  Patient advised follow-up PCP.      ____________________________________________   FINAL CLINICAL IMPRESSION(S) / ED DIAGNOSES  Final diagnoses:  Muscle strain of right scapular region, initial encounter     ED Discharge Orders         Ordered    traMADol (ULTRAM) 50 MG tablet  Every 6 hours PRN     05/12/18 0939    cyclobenzaprine (FLEXERIL) 10 MG tablet  3 times daily PRN      05/12/18 0939    ibuprofen (ADVIL,MOTRIN) 600 MG tablet  Every 8 hours PRN     05/12/18 4128           Note:  This document was prepared using Dragon voice recognition software and may include unintentional dictation errors.    Sable Feil, PA-C 05/12/18 0941    Carrie Mew, MD 05/15/18 2141

## 2018-05-12 NOTE — ED Triage Notes (Signed)
Pt in via POV; sent over from [facility].  Reports right arm injury Sunday while he was lifting a wood stove.  Pt with pain from shoulder to finger tips, decreased ROM, numbness/tingling.  Pt appears uncomfortable.

## 2020-06-14 DIAGNOSIS — M179 Osteoarthritis of knee, unspecified: Secondary | ICD-10-CM | POA: Insufficient documentation

## 2020-11-17 ENCOUNTER — Other Ambulatory Visit
Admission: RE | Admit: 2020-11-17 | Discharge: 2020-11-17 | Disposition: A | Discharge status: Home / Self Care | Payer: Medicare Other | Attending: Ophthalmology | Admitting: Ophthalmology

## 2020-11-17 DIAGNOSIS — H47012 Ischemic optic neuropathy, left eye: Secondary | ICD-10-CM | POA: Insufficient documentation

## 2020-11-17 DIAGNOSIS — H209 Unspecified iridocyclitis: Secondary | ICD-10-CM | POA: Diagnosis present

## 2020-11-17 LAB — CBC
HCT: 42.7 % (ref 39.0–52.0)
Hemoglobin: 13.7 g/dL (ref 13.0–17.0)
MCH: 26.9 pg (ref 26.0–34.0)
MCHC: 32.1 g/dL (ref 30.0–36.0)
MCV: 83.9 fL (ref 80.0–100.0)
Platelets: 458 10*3/uL — ABNORMAL HIGH (ref 150–400)
RBC: 5.09 MIL/uL (ref 4.22–5.81)
RDW: 14.5 % (ref 11.5–15.5)
WBC: 10.1 10*3/uL (ref 4.0–10.5)
nRBC: 0 % (ref 0.0–0.2)

## 2020-11-17 LAB — SEDIMENTATION RATE: Sed Rate: 46 mm/hr — ABNORMAL HIGH (ref 0–20)

## 2020-11-17 LAB — C-REACTIVE PROTEIN: CRP: 1.7 mg/dL — ABNORMAL HIGH (ref <1.0)

## 2020-11-18 LAB — RPR
RPR Ser Ql: REACTIVE — AB
RPR Titer: 1:64 {titer}

## 2020-11-18 LAB — ANA: Anti Nuclear Antibody (ANA): POSITIVE — AB

## 2020-11-20 LAB — ANCA TITERS
Atypical P-ANCA titer: <1:20 {titer}
C-ANCA: <1:20 {titer}
P-ANCA: <1:20 {titer}

## 2020-11-20 LAB — BARTONELLA ANTIBODY PANEL
B Quintana IgM: NEGATIVE titer
B henselae IgG: NEGATIVE titer
B henselae IgM: NEGATIVE titer
B quintana IgG: NEGATIVE titer

## 2020-11-20 LAB — T.PALLIDUM AB, TOTAL: T Pallidum Abs: REACTIVE — AB

## 2020-11-21 LAB — QUANTIFERON-TB GOLD PLUS (RQFGPL)
QuantiFERON Mitogen Value: >10 IU/mL
QuantiFERON Nil Value: 0.01 IU/mL
QuantiFERON TB1 Ag Value: 0.02 IU/mL
QuantiFERON TB2 Ag Value: 0.01 IU/mL

## 2020-11-21 LAB — QUANTIFERON-TB GOLD PLUS: QuantiFERON-TB Gold Plus: NEGATIVE

## 2020-11-21 LAB — ANGIOTENSIN CONVERTING ENZYME: Angiotensin-Converting Enzyme: 115 U/L — ABNORMAL HIGH (ref 14–82)

## 2020-12-12 ENCOUNTER — Telehealth: Payer: Self-pay

## 2020-12-12 ENCOUNTER — Ambulatory Visit: Payer: Medicare Other | Admitting: Infectious Diseases

## 2020-12-12 NOTE — Telephone Encounter (Signed)
Discussed No show with patient. He  needs Bicillin and will arrive at 10 am for 1015 on 12/14/2020.

## 2020-12-14 ENCOUNTER — Ambulatory Visit: Payer: Medicare Other | Admitting: Infectious Diseases

## 2020-12-21 ENCOUNTER — Telehealth: Payer: Self-pay

## 2020-12-21 DIAGNOSIS — A5271 Late syphilitic oculopathy: Secondary | ICD-10-CM | POA: Insufficient documentation

## 2020-12-21 DIAGNOSIS — F1510 Other stimulant abuse, uncomplicated: Secondary | ICD-10-CM | POA: Insufficient documentation

## 2020-12-21 NOTE — Telephone Encounter (Signed)
Received call from [facility] team requesting advice for patient. 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 Pharmacist team stated they wanted to consider 10-14 days of IM bicillin with supplemental oral treatment. Stated the patient is not a good candidate for infusion/home health; didn't say why. RN explained that our office does not typically do daily IM injections of bicillin; forwarding to Dr. Delaine Lame and CMA for review.   Paolina Karwowski Lorita Officer, RN

## 2020-12-22 NOTE — Telephone Encounter (Signed)
I dont do at [facility] - we are short on IM bicillin. Health dept should be able to do this. Pleaes let them know to contact [facility]

## 2020-12-30 ENCOUNTER — Emergency Department
Admission: EM | Admit: 2020-12-30 | Discharge: 2020-12-30 | Disposition: A | Discharge status: Home / Self Care | Payer: Medicare Other | Attending: Emergency Medicine | Admitting: Emergency Medicine

## 2020-12-30 ENCOUNTER — Encounter: Payer: Self-pay | Admitting: Emergency Medicine

## 2020-12-30 ENCOUNTER — Other Ambulatory Visit: Payer: Self-pay

## 2020-12-30 DIAGNOSIS — Y829 Unspecified medical devices associated with adverse incidents: Secondary | ICD-10-CM | POA: Diagnosis not present

## 2020-12-30 DIAGNOSIS — I10 Essential (primary) hypertension: Secondary | ICD-10-CM | POA: Insufficient documentation

## 2020-12-30 DIAGNOSIS — F17210 Nicotine dependence, cigarettes, uncomplicated: Secondary | ICD-10-CM | POA: Insufficient documentation

## 2020-12-30 DIAGNOSIS — T859XXA Unspecified complication of internal prosthetic device, implant and graft, initial encounter: Secondary | ICD-10-CM

## 2020-12-30 DIAGNOSIS — T85898A Other specified complication of other internal prosthetic devices, implants and grafts, initial encounter: Secondary | ICD-10-CM | POA: Diagnosis present

## 2020-12-30 NOTE — ED Triage Notes (Signed)
Pateitn has a right upper arm picc line in place.  Receiving IV antibiotics.  States while infusion was going, his dog got tangled up and pulled IV tubing off of PICC line.  Patient clamped line.    End of line cleansed and new clave IV end placed on PICC.  Picc flushed without resistance and pulls blood back as well.

## 2020-12-31 NOTE — ED Provider Notes (Signed)
[facility] Emergency Department Provider Note   ____________________________________________   Event Date/Time   First MD Initiated Contact with Patient 12/30/20 1929     (approximate)  I have reviewed the triage vital signs and the nursing notes.   HISTORY  Chief Complaint medical devise problem    HPI Craig Nolan is a 55 y.o. male with a history of recent PICC line placement for continuous penicillin infusion who presents after his pet allegedly knocked his PICC line loose and the pump stopped working.  Prior to my evaluation nursing staff had already done troubleshooting on this device as well as informed me that PICC line was patent that could flush and draw back blood without difficulty.  Patient's continuous antibiotic infusion was restarted without difficulty and patient denies any pain at this time.  Patient denies any other complaints.          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   Lumbar spondylosis 05/25/2015   Chronic pain 05/25/2015   Musculoskeletal pain 05/25/2015   Neurogenic pain 05/25/2015   Neuropathic pain 05/25/2015   Long term prescription benzodiazepine use 05/25/2015   Encounter for therapeutic drug level monitoring 05/25/2015   Chronic lower extremity pain (Location of Primary Source of Pain) (Left) 05/25/2015   Chronic lumbar radicular pain 05/25/2015   Marijuana use  05/25/2015   Cocaine use 80/99/8338   Illicit drug use 25/07/3974   Acute back pain with sciatica 05/25/2015   Insomnia 05/25/2015   Chronic constipation 05/25/2015   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   Chronic hip pain (Location of Tertiary source of pain) (Bilateral) (L>R) 05/25/2015   Bipolar disorder ([place]) 05/25/2015   Sciatica 03/30/2015   Hyperlipidemia    Tobacco use     Past Surgical History:  Procedure Laterality Date   BACK SURGERY     CHOLECYSTECTOMY     COLONOSCOPY     CYSTECTOMY     arms   FOOT SURGERY Right    HAMMER TOE SURGERY Left 02/21/2017   Procedure: HAMMER TOE CORRECTION X2 left 4th and 5th;  Surgeon: Sharlotte Alamo, DPM;  Location: [facility] ORS;  Service: Podiatry;  Laterality: Left;   KNEE SURGERY Right    cartilege repair    Prior to Admission medications   Medication Sig Start Date End Date Taking? Authorizing Provider  cyclobenzaprine (FLEXERIL) 10 MG tablet Take 1 tablet (10 mg total) by mouth 3 (three) times daily as needed. 05/12/18   Sable Feil, PA-C  ibuprofen (ADVIL,MOTRIN) 600 MG tablet Take 1 tablet (600 mg total) by mouth every 8 (eight) hours as needed. 05/12/18   Sable Feil, PA-C  Multiple Vitamins-Minerals (CENTRUM SILVER ADULT 50+ PO) Take 1 tablet by mouth daily.     [provider]  sildenafil (REVATIO) 20 MG tablet Take up to 5 pills about 30 min  prior to sex 10/31/17   Olin Hauser, DO    Allergies Hydrocodone-acetaminophen  Family History  Problem Relation Age of Onset   Diabetes Mother    Cancer Father    Stroke Father     Social History Social History   Tobacco Use   Smoking status: Every Day    Packs/day: 1.00    Years: 20.00    Pack years: 20.00    Types: Cigarettes   Smokeless tobacco: Current  Vaping Use   Vaping Use: Never used  Substance Use Topics   Alcohol use: Not Currently    Alcohol/week: 0.0 standard drinks   Drug use: Not Currently    Types: Cocaine, Marijuana     Review of Systems Constitutional: No fever/chills Eyes: No visual changes. ENT: No sore throat. Cardiovascular: Denies chest pain. Respiratory: Denies shortness of breath. Gastrointestinal: No abdominal pain.  No nausea, no vomiting.  No diarrhea. Genitourinary: Negative for dysuria. Musculoskeletal: Negative for acute arthralgias Skin: Negative for rash. Neurological: Negative for headaches, weakness/numbness/paresthesias in any extremity Psychiatric: Negative for suicidal ideation/homicidal ideation   ____________________________________________   PHYSICAL EXAM:  VITAL SIGNS: ED Triage Vitals [12/30/20 1854]  Enc Vitals Group     BP      Pulse      Resp      Temp      Temp src      SpO2      Weight 177 lb 14.6 oz (80.7 kg)     Height 5\' 9"  (1.753 m)     Head Circumference      Peak Flow      Pain Score 0     Pain Loc      Pain Edu?      Excl. in [place]?    Constitutional: Alert and oriented. Well appearing and in no acute distress. Eyes: Conjunctivae are normal. PERRL. Head: Atraumatic. Nose: No congestion/rhinnorhea. Mouth/Throat: Mucous membranes are moist. Neck: No stridor Cardiovascular: Grossly normal heart sounds.  Good peripheral circulation. Respiratory: Normal respiratory effort.  No retractions. Gastrointestinal: Soft and nontender. No distention. Musculoskeletal: No obvious deformities Neurologic:  Normal speech and language. No gross focal neurologic deficits are appreciated. Skin: PICC line to right upper extremity intact and flushing/drying blood well.  No surrounding edema or tenderness to palpation.  Skin is warm and dry. No rash noted. Psychiatric: Mood and affect are normal. Speech and behavior are normal.  ____________________________________________   LABS (all labs ordered are listed, but only abnormal results are displayed)  Labs Reviewed - No data to display  PROCEDURES  Procedure(s) performed (including Critical  Care):  Procedures   ____________________________________________   INITIAL IMPRESSION / ASSESSMENT AND PLAN / ED COURSE  As part of my medical decision making, I reviewed the following data within the electronic medical record, if available:  Nursing notes reviewed and incorporated, Labs reviewed, EKG interpreted, Old chart reviewed, Radiograph reviewed and Notes from prior ED visits reviewed and incorporated       Patient a 55 year old male who presents for possible PICC line complication after a pet allegedly knocked it "loose". No immediate complications found on exam.  Line flushes and draws well and continuous IV antibiotic infusion restarted prior to discharge.  Dispo: Discharge home with PCP follow-up      ____________________________________________   FINAL CLINICAL IMPRESSION(S) / ED DIAGNOSES  Final diagnoses:  Device, implant, or graft complication, initial encounter     ED Discharge Orders     None  Note:  This document was prepared using Dragon voice recognition software and may include unintentional dictation errors.    Naaman Plummer, MD 12/31/20 [phone]

## 2021-01-18 ENCOUNTER — Encounter: Payer: Self-pay | Admitting: Infectious Diseases

## 2021-01-18 ENCOUNTER — Other Ambulatory Visit: Payer: Self-pay

## 2021-01-18 ENCOUNTER — Other Ambulatory Visit
Admission: RE | Admit: 2021-01-18 | Discharge: 2021-01-18 | Disposition: A | Discharge status: Home / Self Care | Payer: Medicare Other | Source: Ambulatory Visit | Attending: Infectious Diseases | Admitting: Infectious Diseases

## 2021-01-18 ENCOUNTER — Ambulatory Visit: Payer: Medicare Other | Attending: Infectious Diseases | Admitting: Infectious Diseases

## 2021-01-18 VITALS — BP 148/97 | HR 89 | Resp 16 | Ht 69.0 in | Wt 184.0 lb

## 2021-01-18 DIAGNOSIS — A5271 Late syphilitic oculopathy: Secondary | ICD-10-CM | POA: Diagnosis not present

## 2021-01-18 DIAGNOSIS — I10 Essential (primary) hypertension: Secondary | ICD-10-CM | POA: Insufficient documentation

## 2021-01-18 DIAGNOSIS — F1590 Other stimulant use, unspecified, uncomplicated: Secondary | ICD-10-CM | POA: Diagnosis not present

## 2021-01-18 DIAGNOSIS — F1411 Cocaine abuse, in remission: Secondary | ICD-10-CM | POA: Insufficient documentation

## 2021-01-18 DIAGNOSIS — F1591 Other stimulant use, unspecified, in remission: Secondary | ICD-10-CM | POA: Diagnosis not present

## 2021-01-18 DIAGNOSIS — E785 Hyperlipidemia, unspecified: Secondary | ICD-10-CM | POA: Diagnosis not present

## 2021-01-18 DIAGNOSIS — F319 Bipolar disorder, unspecified: Secondary | ICD-10-CM | POA: Diagnosis not present

## 2021-01-18 DIAGNOSIS — F17210 Nicotine dependence, cigarettes, uncomplicated: Secondary | ICD-10-CM | POA: Insufficient documentation

## 2021-01-18 LAB — COMPREHENSIVE METABOLIC PANEL
ALT: 22 U/L (ref 0–44)
AST: 23 U/L (ref 15–41)
Albumin: 3.2 g/dL — ABNORMAL LOW (ref 3.5–5.0)
Alkaline Phosphatase: 70 U/L (ref 38–126)
Anion gap: 5 (ref 5–15)
BUN: 24 mg/dL — ABNORMAL HIGH (ref 6–20)
CO2: 28 mmol/L (ref 22–32)
Calcium: 8.6 mg/dL — ABNORMAL LOW (ref 8.9–10.3)
Chloride: 105 mmol/L (ref 98–111)
Creatinine, Ser: 0.76 mg/dL (ref 0.61–1.24)
GFR, Estimated: >60 mL/min (ref >60)
Glucose, Bld: 119 mg/dL — ABNORMAL HIGH (ref 70–99)
Potassium: 4.1 mmol/L (ref 3.5–5.1)
Sodium: 138 mmol/L (ref 135–145)
Total Bilirubin: 0.6 mg/dL (ref 0.3–1.2)
Total Protein: 7 g/dL (ref 6.5–8.1)

## 2021-01-18 LAB — CBC WITH DIFFERENTIAL/PLATELET
Abs Immature Granulocytes: 0.04 10*3/uL (ref 0.00–0.07)
Basophils Absolute: 0.1 10*3/uL (ref 0.0–0.1)
Basophils Relative: 1 %
Eosinophils Absolute: 0.3 10*3/uL (ref 0.0–0.5)
Eosinophils Relative: 4 %
HCT: 37.6 % — ABNORMAL LOW (ref 39.0–52.0)
Hemoglobin: 12.2 g/dL — ABNORMAL LOW (ref 13.0–17.0)
Immature Granulocytes: 1 %
Lymphocytes Relative: 27 %
Lymphs Abs: 2.2 10*3/uL (ref 0.7–4.0)
MCH: 28.1 pg (ref 26.0–34.0)
MCHC: 32.4 g/dL (ref 30.0–36.0)
MCV: 86.6 fL (ref 80.0–100.0)
Monocytes Absolute: 0.5 10*3/uL (ref 0.1–1.0)
Monocytes Relative: 6 %
Neutro Abs: 5.1 10*3/uL (ref 1.7–7.7)
Neutrophils Relative %: 61 %
Platelets: 379 10*3/uL (ref 150–400)
RBC: 4.34 MIL/uL (ref 4.22–5.81)
RDW: 14.5 % (ref 11.5–15.5)
WBC: 8.3 10*3/uL (ref 4.0–10.5)
nRBC: 0 % (ref 0.0–0.2)

## 2021-01-18 NOTE — Progress Notes (Signed)
NAME: [name]  DOB: 07/12/1965  MRN: [id number]  Date/Time: 01/18/2021 10:09 AM  REQUESTING PROVIDER : Dr.Brasington Subjective:  REASON FOR CONSULT: ocular syphilis ?pt isa poor historian- chart reviewed and [facility] records in care everywhere reviewed Craig Nolan is a 55 y.o. with a history of  Bipolar disorder, substance use, HTN, hyperlipidemia not on any meds , no PCP, he is apoor historian- referred to me for ocular syphilis Went to [facility] first week of september  For vision issues and noted to have inflammation in the rt eye. Work up revealed RPR 1:64 . He was referred to me then and he was NO SHOW for 2 appts. He was referred to [facility] retina specialist Dr. Ashley Jacobs whom he saw on 12/19/20 and he referred him urgently to [facility] ID . He saw Dr. Enzo Bi on 12/21/20 and he admitted him to the hospital on 12/22/20 for IV penicillin as pt had said he couldn't do IV antibiotics at home. He stayed in the hospital 10/14-10/17 and left AMA on 12/25/20. Pt then showed up to [facility] ED on 10/22 with a PICC line problem- which was resolved by flusing in the ED- there are no notes in care-every where about when the picc was placed and how long he got antibiotics. After calling [facility] pharmacy it looks like patient got IV penicillin 10/19-10/26 Pt has a follow up appt with Dr.Bramson on 01/25/21 But he is here to see me and says it is convenient to be seen in [place] He wants to get the RPR test today Says his vision is not improved much He is followed by Dr.Brasington and wa son steroid drops Current smoker Does crystal meth  Past h/o cocaine use Denies IVDA HIV neg from 12/21/20 at [facility]  Past Medical History:  Diagnosis Date   Bipolar disorder ([place])    Drug abuse ([place])    Dyslipidemia    Hemorrhoids    Hyperlipidemia    Hypertension    PTSD (post-traumatic stress disorder)    Seasonal allergies    Sleep apnea    denies sleep apnea   Tobacco use     Past Surgical  History:  Procedure Laterality Date   BACK SURGERY     CHOLECYSTECTOMY     COLONOSCOPY     CYSTECTOMY     arms   FOOT SURGERY Right    HAMMER TOE SURGERY Left 02/21/2017   Procedure: HAMMER TOE CORRECTION X2 left 4th and 5th;  Surgeon: Sharlotte Alamo, DPM;  Location: [facility] ORS;  Service: Podiatry;  Laterality: Left;   KNEE SURGERY Right    cartilege repair    Social History   Socioeconomic History   Marital status: Legally Separated    Spouse name: Not on file   Number of children: Not on file   Years of education: Not on file   Highest education level: Not on file  Occupational History   Not on file  Tobacco Use   Smoking status: Every Day    Packs/day: 1.00    Years: 20.00    Pack years: 20.00    Types: Cigarettes   Smokeless tobacco: Current  Vaping Use   Vaping Use: Never used  Substance and Sexual Activity   Alcohol use: Not Currently    Alcohol/week: 0.0 standard drinks   Drug use: Not Currently    Types: Cocaine, Marijuana   Sexual activity: Not on file  Other Topics Concern   Not on file  Social History Narrative  Not on file   Social Determinants of Health   Financial Resource Strain: Not on file  Food Insecurity: Not on file  Transportation Needs: Not on file  Physical Activity: Not on file  Stress: Not on file  Social Connections: Not on file  Intimate Partner Violence: Not on file    Family History  Problem Relation Age of Onset   Diabetes Mother    Cancer Father    Stroke Father    Allergies  Allergen Reactions   Hydrocodone-Acetaminophen Nausea And Vomiting   I? Current Outpatient Medications  Medication Sig Dispense Refill   amoxicillin (AMOXIL) 500 MG tablet Take 500 mg by mouth 2 (two) times daily.     Aspirin-Acetaminophen (GOODYS BODY PAIN PO) Take by mouth.     No current facility-administered medications for this visit.     Abtx:  Anti-infectives (From admission, onward)    None       REVIEW OF SYSTEMS:  Const:  negative fever, negative chills, negative weight loss Eyes: has visual changes, negative eye pain ENT: negative coryza, negative sore throat Poor dentition- hurts and he has dental appt for tooth reomal after he takes a week of amoxicillin Resp: negative cough, hemoptysis, dyspnea Cards: negative for chest pain, palpitations, lower extremity edema GU: negative for frequency, dysuria and hematuria GI: Negative for abdominal pain, diarrhea, bleeding, constipation Skin: negative for rash and pruritus Heme: negative for easy bruising and gum/nose bleeding MS: negative for myalgias, arthralgias, back pain and muscle weakness Neurolo:negative for headaches, dizziness, vertigo, memory problems  Psych: negative for feelings of anxiety, depression  Endocrine: negative for thyroid, diabetes Allergy/Immunology- negative for any medication or food allergies ? Pertinent Positives include : Objective:  VITALS:  BP (!) 148/97   Pulse 89   Resp 16   Ht 5\' 9"  (1.753 m)   Wt 184 lb (83.5 kg)   SpO2 98%   BMI 27.17 kg/m  PHYSICAL EXAM:  General: Alert, cooperative, no distress, appears stated age. Disheveled, poor hygiene Head: Normocephalic, without obvious abnormality, atraumatic. Eyes: Conjunctivae clear, anicteric sclerae. Pupils are equal ENT broken teeth , only a few remaining on the lower  Lips, mucosa, and tongue normal. No Thrush Neck: Supple, symmetrical, no adenopathy, thyroid: non tender no carotid bruit and no JVD. Back: No CVA tenderness. Lungs: Clear to auscultation bilaterally. No Wheezing or Rhonchi. No rales. Heart: Regular rate and rhythm, no murmur, rub or gallop. Abdomen: Soft, non-tender,not distended. Bowel sounds normal. No masses Extremities: atraumatic, no cyanosis. No edema. No clubbing Skin:limited examination Dirty nails Lymph: Cervical, supraclavicular normal. Neurologic: Grossly non-focal Pertinent Labs Lab Results CBC    Component Value Date/Time   WBC  10.1 11/17/2020 1604   RBC 5.09 11/17/2020 1604   HGB 13.7 11/17/2020 1604   HGB 14.3 11/18/2014 1029   HCT 42.7 11/17/2020 1604   HCT 40.2 11/18/2014 1029   PLT 458 (H) 11/17/2020 1604   PLT 285 11/18/2014 1029   MCV 83.9 11/17/2020 1604   MCV 91 11/18/2014 1029   MCV 91 10/07/2013 1133   MCH 26.9 11/17/2020 1604   MCHC 32.1 11/17/2020 1604   RDW 14.5 11/17/2020 1604   RDW 13.6 11/18/2014 1029   RDW 14.3 10/07/2013 1133   LYMPHSABS 2,498 01/22/2018 0849   LYMPHSABS 3.3 (H) 11/18/2014 1029   LYMPHSABS 3.1 10/07/2013 1133   MONOABS 740 11/02/2015 1102   MONOABS 0.6 10/07/2013 1133   EOSABS 255 01/22/2018 0849   EOSABS 0.2 11/18/2014 1029   EOSABS  0.2 10/07/2013 1133   BASOSABS 110 01/22/2018 0849   BASOSABS 0.1 11/18/2014 1029   BASOSABS 0.1 10/07/2013 1133    CMP Latest Ref Rng & Units 01/22/2018 02/19/2017 11/19/2016  Glucose 65 - 99 mg/dL 90 - 99  BUN 7 - 25 mg/dL 10 - 13  Creatinine 0.70 - 1.33 mg/dL 0.73 - 0.97  Sodium 135 - 146 mmol/L 142 - 140  Potassium 3.5 - 5.3 mmol/L 4.4 3.5 3.3(L)  Chloride 98 - 110 mmol/L 105 - 102  CO2 20 - 32 mmol/L 31 - 30  Calcium 8.6 - 10.3 mg/dL 8.6 - 8.8  Total Protein 6.1 - 8.1 g/dL 6.0(L) - 6.2  Total Bilirubin 0.2 - 1.2 mg/dL 0.3 - 0.3  Alkaline Phos 40 - 115 U/L - - -  AST 10 - 35 U/L 21 - 16  ALT 9 - 46 U/L 26 - 8(L)      Microbiology: No results found for this or any previous visit (from the past 240 hour(s)).  IMAGING RESULTS: I have personally reviewed the films ? Impression/Recommendation ?ocular syphilis- is being treated by [facility] ID Dr.Bramson- as per patient he has completed 10 days of Iv penicillin- need to get that information from [facility]( not in care everywhere) . Spoke to pharmacist Herschel Senegal who said patient got antibiotics 10/14-10/16 and 10/19-10/26 . Left a message for Dr.Bramson to call me Depending on the conversation with him will decide whether he needs further treatment Pt says he cannot do IV at home He  could come to infusion center for daily Im injections His Bipolar, substance use and anger could be an issue with adherence and compliance- discussed that with him in detail Will repeat RPR / some labs today Will  follow once I speak with Dr.Bramson ? ?Bipolar disorder Smoker Substance  use Poor dentition with dental abscess/caries- is being followed by dental- on amoxicillin  Pt needs to get a PCP ___________________________________________________ Discussed with patient,in great detail

## 2021-01-18 NOTE — Patient Instructions (Addendum)
You have completed 10 days of Iv penicillin for ocular syphilis on 01/03/21 Today we will check  RPR ( though it may not show a decline yet). You will need long acting benzathine penicillin after that  - will see you in 2 weeks

## 2021-01-19 LAB — RPR
RPR Ser Ql: REACTIVE — AB
RPR Titer: 1:128 {titer}

## 2021-01-22 LAB — T.PALLIDUM AB, TOTAL: T Pallidum Abs: REACTIVE — AB

## 2021-01-23 ENCOUNTER — Other Ambulatory Visit: Payer: Self-pay | Admitting: Infectious Diseases

## 2021-01-23 ENCOUNTER — Other Ambulatory Visit (HOSPITAL_COMMUNITY): Payer: Self-pay

## 2021-01-23 ENCOUNTER — Other Ambulatory Visit: Payer: Self-pay

## 2021-01-23 ENCOUNTER — Encounter: Payer: Self-pay | Admitting: Pharmacist

## 2021-01-23 DIAGNOSIS — A5271 Late syphilitic oculopathy: Secondary | ICD-10-CM

## 2021-01-23 MED ORDER — PENICILLIN G PROCAINE 600000 UNIT/ML IM SUSP: 2.4000 10*6.[IU] | INTRAMUSCULAR | Status: AC

## 2021-01-23 MED ORDER — PROBENECID 500 MG PO TABS
500.0000 mg | ORAL_TABLET | Freq: Four times a day (QID) | ORAL | 0 refills | Status: DC
  Filled 2021-01-23: qty 56, 14d supply, fill #0

## 2021-01-23 NOTE — Progress Notes (Signed)
Procaine penicillin G 2.4 million units IM for 14 days order placed

## 2021-01-23 NOTE — Progress Notes (Signed)
01/23/21 Spoke with Patient- Shared RPR result which is increased to 1:128 on 01/18/21 from 1:64 on 11/17/20 . Pt received incomplete and intermittent  treatment at [facility] and OP  with Penicillin IV 10/14-10/16 ( left AMA 10/17)  and then 10/19-10/26 at home with difficulty in administering the medication and  with 6 hr gaps between doses  and also the last day the infusion was disconnected 10 hrs prematurely . So the bottom line is patient did not get proper treatment He does not want IV again- no PICC- He is adamant about that. So will do the next best option which is Procaine penicillin 2.4 million units IM QD X 14 days + probenecid 500mg  PO Q 6 for 14 days. Will arrange at day surgery to get IM injections every day and on Thanksgiving patient will have to go to [facility] for the injections. Discussed with Day surgery and Ms. Barbette Merino will arrange for daily injections. He will get probenecid from the pharmacy at [facility]

## 2021-01-24 ENCOUNTER — Other Ambulatory Visit: Payer: Self-pay

## 2021-01-24 ENCOUNTER — Telehealth: Payer: Self-pay

## 2021-01-24 ENCOUNTER — Ambulatory Visit
Admission: RE | Admit: 2021-01-24 | Discharge: 2021-01-24 | Disposition: A | Discharge status: Home / Self Care | Payer: Medicare Other | Source: Ambulatory Visit | Attending: Infectious Diseases | Admitting: Infectious Diseases

## 2021-01-24 DIAGNOSIS — A5271 Late syphilitic oculopathy: Secondary | ICD-10-CM | POA: Diagnosis not present

## 2021-01-24 MED ORDER — PENICILLIN G PROCAINE 600000 UNIT/ML IM SUSP
2.4000 10*6.[IU] | Freq: Every day | INTRAMUSCULAR | Status: DC
  Administered 2021-01-24: 2.4 10*6.[IU] via INTRAMUSCULAR
  Filled 2021-01-24 (×3): qty 4

## 2021-01-24 MED ORDER — PENICILLIN G PROCAINE 600000 UNIT/ML IM SUSP
2.4000 10*6.[IU] | Freq: Every day | INTRAMUSCULAR | Status: DC
  Filled 2021-01-24 (×2): qty 4

## 2021-01-24 NOTE — Telephone Encounter (Signed)
Patient contacted [facility] and left a voicemail stating that he was never contacted about his shot that he was scheduled for today. Forwarding to [facility] staff for follow up.  Requested call back [place], RN

## 2021-01-25 ENCOUNTER — Ambulatory Visit
Admission: RE | Admit: 2021-01-25 | Discharge: 2021-01-25 | Disposition: A | Discharge status: Home / Self Care | Payer: Medicare Other | Source: Ambulatory Visit | Attending: Infectious Diseases | Admitting: Infectious Diseases

## 2021-01-25 DIAGNOSIS — A539 Syphilis, unspecified: Secondary | ICD-10-CM | POA: Insufficient documentation

## 2021-01-25 MED ORDER — PENICILLIN G PROCAINE 600000 UNIT/ML IM SUSP
2.4000 10*6.[IU] | Freq: Once | INTRAMUSCULAR | Status: AC
Start: 2021-01-25 — End: 2021-01-25
  Administered 2021-01-25: 14:00:00 2.4 10*6.[IU] via INTRAMUSCULAR
  Filled 2021-01-25: qty 4

## 2021-01-26 ENCOUNTER — Ambulatory Visit
Admission: RE | Admit: 2021-01-26 | Discharge: 2021-01-26 | Disposition: A | Discharge status: Home / Self Care | Payer: Medicare Other | Source: Ambulatory Visit | Attending: Infectious Diseases | Admitting: Infectious Diseases

## 2021-01-26 DIAGNOSIS — A539 Syphilis, unspecified: Secondary | ICD-10-CM | POA: Insufficient documentation

## 2021-01-26 MED ORDER — PENICILLIN G PROCAINE 600000 UNIT/ML IM SUSP
2.4000 10*6.[IU] | Freq: Once | INTRAMUSCULAR | Status: AC
  Administered 2021-01-26: 2.4 10*6.[IU] via INTRAMUSCULAR
  Filled 2021-01-26 (×2): qty 4

## 2021-01-27 ENCOUNTER — Ambulatory Visit
Admission: RE | Admit: 2021-01-27 | Discharge: 2021-01-27 | Disposition: A | Discharge status: Home / Self Care | Payer: Medicare Other | Source: Ambulatory Visit | Attending: Infectious Diseases | Admitting: Infectious Diseases

## 2021-01-27 DIAGNOSIS — A5271 Late syphilitic oculopathy: Secondary | ICD-10-CM | POA: Diagnosis not present

## 2021-01-27 MED ORDER — PENICILLIN G PROCAINE 600000 UNIT/ML IM SUSP
2.4000 10*6.[IU] | Freq: Once | INTRAMUSCULAR | Status: AC
  Administered 2021-01-27: 2.4 10*6.[IU] via INTRAMUSCULAR
  Filled 2021-01-27: qty 4

## 2021-01-28 ENCOUNTER — Ambulatory Visit
Admission: RE | Admit: 2021-01-28 | Discharge: 2021-01-28 | Disposition: A | Discharge status: Home / Self Care | Payer: Medicare Other | Source: Ambulatory Visit | Attending: Infectious Diseases | Admitting: Infectious Diseases

## 2021-01-28 ENCOUNTER — Other Ambulatory Visit: Payer: Self-pay

## 2021-01-28 DIAGNOSIS — A539 Syphilis, unspecified: Secondary | ICD-10-CM | POA: Insufficient documentation

## 2021-01-28 MED ORDER — PENICILLIN G PROCAINE 600000 UNIT/ML IM SUSP
2.4000 10*6.[IU] | Freq: Once | INTRAMUSCULAR | Status: AC
  Administered 2021-01-28: 2.4 10*6.[IU] via INTRAMUSCULAR
  Filled 2021-01-28: qty 4

## 2021-01-29 ENCOUNTER — Ambulatory Visit
Admission: RE | Admit: 2021-01-29 | Discharge: 2021-01-29 | Disposition: A | Discharge status: Home / Self Care | Payer: Medicare Other | Source: Ambulatory Visit | Attending: Infectious Diseases | Admitting: Infectious Diseases

## 2021-01-29 DIAGNOSIS — A539 Syphilis, unspecified: Secondary | ICD-10-CM | POA: Insufficient documentation

## 2021-01-29 MED ORDER — PENICILLIN G PROCAINE 600000 UNIT/ML IM SUSP
2.4000 10*6.[IU] | Freq: Once | INTRAMUSCULAR | Status: AC
  Administered 2021-01-29: 2.4 10*6.[IU] via INTRAMUSCULAR
  Filled 2021-01-29: qty 4

## 2021-01-30 ENCOUNTER — Telehealth: Payer: Self-pay

## 2021-01-30 ENCOUNTER — Ambulatory Visit
Admission: RE | Admit: 2021-01-30 | Discharge: 2021-01-30 | Disposition: A | Discharge status: Home / Self Care | Payer: Medicare Other | Source: Ambulatory Visit | Attending: Infectious Diseases | Admitting: Infectious Diseases

## 2021-01-30 ENCOUNTER — Other Ambulatory Visit: Payer: Self-pay

## 2021-01-30 DIAGNOSIS — A539 Syphilis, unspecified: Secondary | ICD-10-CM | POA: Insufficient documentation

## 2021-01-30 MED ORDER — PENICILLIN G PROCAINE 600000 UNIT/ML IM SUSP
2.4000 10*6.[IU] | Freq: Once | INTRAMUSCULAR | Status: AC
  Administered 2021-01-30: 2.4 10*6.[IU] via INTRAMUSCULAR
  Filled 2021-01-30 (×2): qty 4

## 2021-01-30 NOTE — Telephone Encounter (Signed)
Patient reminded to go to [facility] ED Thursday for his daily injection. Rachel Bo has verified he will have the medication available to ED> I spoke to intake Freda Munro who is talking with head nurse letting her know that he is coming due to hours in same day.

## 2021-01-31 ENCOUNTER — Ambulatory Visit
Admission: RE | Admit: 2021-01-31 | Discharge: 2021-01-31 | Disposition: A | Discharge status: Home / Self Care | Payer: Medicare Other | Source: Ambulatory Visit | Attending: Infectious Diseases | Admitting: Infectious Diseases

## 2021-01-31 DIAGNOSIS — A539 Syphilis, unspecified: Secondary | ICD-10-CM | POA: Insufficient documentation

## 2021-01-31 MED ORDER — PENICILLIN G PROCAINE 600000 UNIT/ML IM SUSP
2.4000 10*6.[IU] | Freq: Once | INTRAMUSCULAR | Status: AC
  Administered 2021-01-31: 2.4 10*6.[IU] via INTRAMUSCULAR
  Filled 2021-01-31: qty 4

## 2021-02-01 ENCOUNTER — Other Ambulatory Visit: Payer: Self-pay

## 2021-02-01 ENCOUNTER — Encounter: Payer: Self-pay | Admitting: Emergency Medicine

## 2021-02-01 ENCOUNTER — Emergency Department
Admission: EM | Admit: 2021-02-01 | Discharge: 2021-02-01 | Disposition: A | Discharge status: Home / Self Care | Payer: Medicare Other | Attending: Emergency Medicine | Admitting: Emergency Medicine

## 2021-02-01 DIAGNOSIS — A5271 Late syphilitic oculopathy: Secondary | ICD-10-CM | POA: Insufficient documentation

## 2021-02-01 DIAGNOSIS — Z7982 Long term (current) use of aspirin: Secondary | ICD-10-CM | POA: Diagnosis not present

## 2021-02-01 DIAGNOSIS — I10 Essential (primary) hypertension: Secondary | ICD-10-CM | POA: Diagnosis not present

## 2021-02-01 DIAGNOSIS — H5789 Other specified disorders of eye and adnexa: Secondary | ICD-10-CM | POA: Diagnosis present

## 2021-02-01 DIAGNOSIS — F17210 Nicotine dependence, cigarettes, uncomplicated: Secondary | ICD-10-CM | POA: Diagnosis not present

## 2021-02-01 MED ORDER — PENICILLIN G PROCAINE 600000 UNIT/ML IM SUSP
2.4000 10*6.[IU] | Freq: Once | INTRAMUSCULAR | Status: AC
  Administered 2021-02-01: 2.4 10*6.[IU] via INTRAMUSCULAR
  Filled 2021-02-01: qty 4

## 2021-02-01 NOTE — ED Notes (Signed)
Pt LWBS after being examined.

## 2021-02-01 NOTE — Discharge Instructions (Addendum)
Continue with your injections until you have had a total of 14 as ordered by your doctor.

## 2021-02-01 NOTE — ED Notes (Signed)
Patient agitated throughout ED visit.  Reinforced that ED stay was not 'all day' and apologized to patient that he needed antibiotics.  Patient without remorse for his general attitude and behavior toward ED staff.  D/C home

## 2021-02-01 NOTE — ED Provider Notes (Signed)
Emergency Medicine Provider Triage Evaluation Note  Craig Nolan , a 55 y.o. male  was evaluated in triage.  Patient is here for his daily procainamide penicillin G2 400,000 units IM.  Order was placed for 14-day treatment.  He has been going to same-day surgery to get these injections until today.   Review of Systems  Positive: none Negative: none  Physical Exam  Ht 5\' 9"  (1.753 m)   Wt 84 kg   BMI 27.35 kg/m  Gen:   Awake, no distress   Resp:  Normal effort, RRR MSK:   Moves extremities without difficulty, normal gait without assistance. Other:    Medical Decision Making  Medically screening exam initiated at 9:04 AM.  Appropriate orders placed.  Anthoney Harada was informed that the remainder of the evaluation will be completed by another provider, this initial triage assessment does not replace that evaluation, and the importance of remaining in the ED until their evaluation is complete.     Johnn Hai, PA-C 02/01/21 1013    Harvest Dark, MD 02/01/21 1507

## 2021-02-01 NOTE — ED Triage Notes (Signed)
Pt reports here to get 2 shots for his eye infection. Pt reports he does not know the name of the meds that he gets. Pt very rude with staff, states he doesn't understand why we are asking just give him the shots.

## 2021-02-01 NOTE — ED Notes (Addendum)
Pt returned into the building explaining that he stepped away to his car.

## 2021-02-01 NOTE — ED Provider Notes (Signed)
[facility] Emergency Department Provider Note   ____________________________________________   Event Date/Time   First MD Initiated Contact with Patient 02/01/21 [phone]     (approximate)  I have reviewed the triage vital signs and the nursing notes.   HISTORY  Chief Complaint Injections    HPI Craig Nolan is a 55 y.o. male presents to the ED for an injection.  Patient states he is here to get 2 shots for his eye infection.  He reports he has been here every day but does not know the name of the medication nor does he know the name of the doctor that has ordered the medication.  Patient cannot explain the condition for which she is getting medication for.  Looking through recent visits patient was diagnosed by infectious disease with ocular syphilis.  Wycillin 2,400,000 units IM was ordered for the next 14 days.  Patient has been getting these in outpatient day surgery area which is closed today for Thanksgiving.         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 11/15/2016   Erectile dysfunction 10/14/2016   Fatigue 11/02/2015   Chronic low back pain (Location of Secondary source of pain) (Bilateral) (L>R) 05/25/2015   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   Lumbar spondylosis 05/25/2015   Chronic pain 05/25/2015   Musculoskeletal pain 05/25/2015   Neurogenic pain 05/25/2015   Neuropathic pain 05/25/2015   Long term prescription benzodiazepine use  05/25/2015   Encounter for therapeutic drug level monitoring 05/25/2015   Chronic lower extremity pain (Location of Primary Source of Pain) (Left) 05/25/2015   Chronic lumbar radicular pain 05/25/2015   Marijuana use 05/25/2015   Cocaine use 96/06/5407   Illicit drug use 81/19/1478   Acute back pain with sciatica 05/25/2015   Insomnia 05/25/2015   Chronic constipation 05/25/2015   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/25/2015   Chronic hip pain (Location of Tertiary source of pain) (Bilateral) (L>R) 05/25/2015   Bipolar disorder ([place]) 05/25/2015   Sciatica 03/30/2015   Hyperlipidemia    Tobacco use     Past Surgical History:  Procedure Laterality Date   BACK SURGERY     CHOLECYSTECTOMY     COLONOSCOPY     CYSTECTOMY     arms   FOOT SURGERY Right    HAMMER TOE SURGERY Left 02/21/2017   Procedure: HAMMER TOE CORRECTION X2 left 4th and 5th;  Surgeon: Sharlotte Alamo, DPM;  Location: [facility] ORS;  Service: Podiatry;  Laterality: Left;   KNEE SURGERY Right    cartilege repair    Prior to Admission medications   Medication Sig Start Date End Date Taking? Authorizing Provider  Aspirin-Acetaminophen (GOODYS BODY PAIN PO) Take by mouth.    [provider]  ibuprofen (ADVIL) 800 MG tablet Take 800 mg by mouth every 8 (eight) hours as needed.    [provider]  Multiple Vitamin (MULTIVITAMIN) tablet  Take 1 tablet by mouth daily.    [provider]  Nutritional Supplements (CLICK ESPRESSO PROTEIN DRINK PO) Take by mouth.    [provider]  probenecid (BENEMID) 500 MG tablet Take 1 tablet (500 mg total) by mouth every 6 (six) hours. 01/23/21   Tsosie Billing, MD    Allergies Acetaminophen-codeine #3 [acetaminophen-codeine] and Hydrocodone-acetaminophen  Family History  Problem Relation Age of Onset   Diabetes Mother    Cancer Father    Stroke Father     Social History Social History   Tobacco Use   Smoking status: Every Day    Packs/day:  1.00    Years: 20.00    Pack years: 20.00    Types: Cigarettes   Smokeless tobacco: Current  Vaping Use   Vaping Use: Never used  Substance Use Topics   Alcohol use: Not Currently    Alcohol/week: 0.0 standard drinks   Drug use: Not Currently    Types: Cocaine, Marijuana    Review of Systems Constitutional: No fever/chills Eyes: "Eye infection". ENT: No sore throat. Cardiovascular: Denies chest pain. Respiratory: Denies shortness of breath. Gastrointestinal: No abdominal pain.  No nausea, no vomiting.  No diarrhea.   Musculoskeletal: Negative for complaints. Skin: Negative for rash. Neurological: Negative for headaches, focal weakness or numbness. Psychiatric: Bipolar, polysubstance abuse. ____________________________________________   PHYSICAL EXAM:  VITAL SIGNS: ED Triage Vitals  Enc Vitals Group     BP 02/01/21 0904 (!) 119/93     Pulse Rate 02/01/21 0904 89     Resp 02/01/21 0904 20     Temp 02/01/21 0904 98.6 F (37 C)     Temp Source 02/01/21 0904 Oral     SpO2 02/01/21 0904 99 %     Weight 02/01/21 0859 185 lb 3 oz (84 kg)     Height 02/01/21 0859 5\' 9"  (1.753 m)     Head Circumference --      Peak Flow --      Pain Score --      Pain Loc --      Pain Edu? --      Excl. in [place]? --     Constitutional: Alert and oriented. Well appearing and in no acute distress.  Patient is agitated and loud.  He is constantly pacing in the hallway.  It is already been explained to him that we have to get the medication from the pharmacy and that that is the only thing that we are waiting on. Eyes: Conjunctivae are normal. PERRL. EOMI. Head: Atraumatic. Nose: No congestion/rhinnorhea. Mouth/Throat: Mucous membranes are moist.   Neck: No stridor.   Cardiovascular: Normal rate, regular rhythm. Grossly normal heart sounds.  Good peripheral circulation. Respiratory: Normal respiratory effort.  No retractions. Lungs CTAB. Gastrointestinal: Soft and nontender.   Musculoskeletal: Moves upper and lower extremities without any difficulty and normal gait was noted. Neurologic:  Normal speech and language. No gross focal neurologic deficits are appreciated. Skin:  Skin is warm, dry and intact.  Psychiatric: Mood and affect are normal. Speech and behavior are normal.  ____________________________________________   LABS (all labs ordered are listed, but only abnormal results are displayed)  Labs Reviewed - No data to display ____________________________________________  PROCEDURES  Procedure(s) performed (including Critical Care):  Procedures   ____________________________________________   INITIAL IMPRESSION / ASSESSMENT AND PLAN / ED COURSE  As part of my medical decision making, I reviewed the following data within the electronic MEDICAL RECORD NUMBER Notes from prior ED visits and  [place] Controlled Substance Database  55 year old male presents to the ED for an injection of Wycillin 2,400,000 units IM for ongoing treatment of his ocular syphilis.  Patient has been getting his injections in the outpatient same-day surgery area but needed to come to the ED due to area being closed for Thanksgiving.  Review of his records gave information due to patient unable to recall what injection, what Dr. Vonzella Nipple it and why he is getting the injection.  Patient had no reaction and was discharged with instructions to continue his injections as ordered by his doctor.   ____________________________________________   FINAL CLINICAL IMPRESSION(S) / ED DIAGNOSES  Final diagnoses:  Ocular syphilis     ED Discharge Orders     None        Note:  This document was prepared using Dragon voice recognition software and may include unintentional dictation errors. Upper and lower extremities   Philomena Course 02/01/21 1138    Harvest Dark, MD 02/01/21 [phone]

## 2021-02-01 NOTE — ED Notes (Signed)
Informed that he can discharge after waiting 15 minutes.  Patient upset about having to wait the time.  Reinforced that this is policy and procedure for injectable medications.  Patient agrees to wait.

## 2021-02-02 ENCOUNTER — Ambulatory Visit
Admission: RE | Admit: 2021-02-02 | Discharge: 2021-02-02 | Disposition: A | Discharge status: Home / Self Care | Payer: Medicare Other | Source: Ambulatory Visit | Attending: Infectious Diseases | Admitting: Infectious Diseases

## 2021-02-02 DIAGNOSIS — A539 Syphilis, unspecified: Secondary | ICD-10-CM | POA: Insufficient documentation

## 2021-02-02 MED ORDER — PENICILLIN G PROCAINE 600000 UNIT/ML IM SUSP
2.4000 10*6.[IU] | Freq: Once | INTRAMUSCULAR | Status: AC
  Administered 2021-02-02: 2.4 10*6.[IU] via INTRAMUSCULAR
  Filled 2021-02-02: qty 4

## 2021-02-03 ENCOUNTER — Other Ambulatory Visit: Payer: Self-pay

## 2021-02-03 ENCOUNTER — Ambulatory Visit
Admission: RE | Admit: 2021-02-03 | Discharge: 2021-02-03 | Disposition: A | Discharge status: Home / Self Care | Payer: Medicare Other | Source: Ambulatory Visit | Attending: Infectious Diseases | Admitting: Infectious Diseases

## 2021-02-03 DIAGNOSIS — A539 Syphilis, unspecified: Secondary | ICD-10-CM | POA: Diagnosis not present

## 2021-02-03 MED ORDER — PENICILLIN G PROCAINE 600000 UNIT/ML IM SUSP
2.4000 10*6.[IU] | Freq: Once | INTRAMUSCULAR | Status: AC
  Administered 2021-02-03: 2.4 10*6.[IU] via INTRAMUSCULAR
  Filled 2021-02-03 (×2): qty 4

## 2021-02-04 ENCOUNTER — Ambulatory Visit
Admission: RE | Admit: 2021-02-04 | Discharge: 2021-02-04 | Disposition: A | Discharge status: Home / Self Care | Payer: Medicare Other | Source: Ambulatory Visit | Attending: Infectious Diseases | Admitting: Infectious Diseases

## 2021-02-04 DIAGNOSIS — A539 Syphilis, unspecified: Secondary | ICD-10-CM | POA: Diagnosis not present

## 2021-02-04 MED ORDER — PENICILLIN G PROCAINE 600000 UNIT/ML IM SUSP: 2.4000 10*6.[IU] | Freq: Once | INTRAMUSCULAR | Status: DC

## 2021-02-04 MED ORDER — PENICILLIN G PROCAINE 600000 UNIT/ML IM SUSP
2.4000 10*6.[IU] | Freq: Once | INTRAMUSCULAR | Status: DC
  Filled 2021-02-04: qty 4

## 2021-02-04 MED ORDER — PENICILLIN G PROCAINE 600000 UNIT/ML IM SUSP
2.4000 10*6.[IU] | Freq: Once | INTRAMUSCULAR | Status: AC
  Administered 2021-02-04: 13:00:00 2.4 10*6.[IU] via INTRAMUSCULAR
  Filled 2021-02-04 (×2): qty 4

## 2021-02-05 ENCOUNTER — Ambulatory Visit
Admission: RE | Admit: 2021-02-05 | Discharge: 2021-02-05 | Disposition: A | Discharge status: Home / Self Care | Payer: Medicare Other | Source: Ambulatory Visit | Attending: Infectious Diseases | Admitting: Infectious Diseases

## 2021-02-05 ENCOUNTER — Other Ambulatory Visit: Payer: Self-pay

## 2021-02-05 DIAGNOSIS — A539 Syphilis, unspecified: Secondary | ICD-10-CM | POA: Insufficient documentation

## 2021-02-05 MED ORDER — PENICILLIN G PROCAINE 600000 UNIT/ML IM SUSP
2.4000 10*6.[IU] | Freq: Once | INTRAMUSCULAR | Status: AC
  Administered 2021-02-05: 12:00:00 2.4 10*6.[IU] via INTRAMUSCULAR
  Filled 2021-02-05: qty 4

## 2021-02-06 ENCOUNTER — Ambulatory Visit
Admission: RE | Admit: 2021-02-06 | Discharge: 2021-02-06 | Disposition: A | Discharge status: Home / Self Care | Payer: Medicare Other | Source: Ambulatory Visit | Attending: Infectious Diseases | Admitting: Infectious Diseases

## 2021-02-06 ENCOUNTER — Telehealth: Payer: Self-pay

## 2021-02-06 ENCOUNTER — Ambulatory Visit: Payer: Medicare Other | Admitting: Infectious Diseases

## 2021-02-06 DIAGNOSIS — A539 Syphilis, unspecified: Secondary | ICD-10-CM | POA: Diagnosis not present

## 2021-02-06 MED ORDER — PENICILLIN G PROCAINE 600000 UNIT/ML IM SUSP
2.4000 10*6.[IU] | Freq: Once | INTRAMUSCULAR | Status: AC
  Administered 2021-02-06: 2.4 10*6.[IU] via INTRAMUSCULAR
  Filled 2021-02-06: qty 4

## 2021-02-06 NOTE — Telephone Encounter (Signed)
Patient missed appt today at 945 am. I called and VM is full. Will try again.

## 2021-02-08 ENCOUNTER — Other Ambulatory Visit: Payer: Self-pay

## 2021-02-08 ENCOUNTER — Encounter: Payer: Self-pay | Admitting: Infectious Diseases

## 2021-02-08 ENCOUNTER — Ambulatory Visit: Payer: Medicare Other | Attending: Infectious Diseases | Admitting: Infectious Diseases

## 2021-02-08 VITALS — BP 130/96 | HR 67 | Resp 16 | Ht 69.0 in | Wt 185.0 lb

## 2021-02-08 DIAGNOSIS — E785 Hyperlipidemia, unspecified: Secondary | ICD-10-CM | POA: Insufficient documentation

## 2021-02-08 DIAGNOSIS — A5271 Late syphilitic oculopathy: Secondary | ICD-10-CM | POA: Diagnosis present

## 2021-02-08 DIAGNOSIS — I10 Essential (primary) hypertension: Secondary | ICD-10-CM | POA: Insufficient documentation

## 2021-02-08 DIAGNOSIS — F17210 Nicotine dependence, cigarettes, uncomplicated: Secondary | ICD-10-CM | POA: Insufficient documentation

## 2021-02-08 DIAGNOSIS — Z23 Encounter for immunization: Secondary | ICD-10-CM | POA: Diagnosis not present

## 2021-02-08 DIAGNOSIS — F1490 Cocaine use, unspecified, uncomplicated: Secondary | ICD-10-CM | POA: Insufficient documentation

## 2021-02-08 DIAGNOSIS — F1290 Cannabis use, unspecified, uncomplicated: Secondary | ICD-10-CM | POA: Diagnosis not present

## 2021-02-08 DIAGNOSIS — F319 Bipolar disorder, unspecified: Secondary | ICD-10-CM | POA: Diagnosis not present

## 2021-02-08 MED ORDER — PENICILLIN G BENZATHINE 1200000 UNIT/2ML IM SUSY
1.2000 10*6.[IU] | PREFILLED_SYRINGE | Freq: Once | INTRAMUSCULAR | Status: AC
Start: 2021-02-08 — End: 2021-02-08
  Administered 2021-02-08: 1.2 10*6.[IU] via INTRAMUSCULAR

## 2021-02-08 NOTE — Progress Notes (Signed)
Left [facility].  NAME: Craig Nolan  DOB: [birth year]  MRN: [id number]  Date/Time: 02/08/2021 12:34 PM  Subjective:  Pt is here for follow up  Just completed 14 days of  penicillin for ocular syphilis He went to day surgery every day and got IM injections Took it with probenecid Has some soreness in the buttocks Other wise doing well  Following is taken from the notes before Craig Nolan is a 55 y.o. with a history of  Bipolar disorder, substance use, HTN, hyperlipidemia not on any meds , no PCP, he is apoor historian- referred to me for ocular syphilis Went to [facility] first week of september  For vision issues and noted to have inflammation in the rt eye. Work up revealed RPR 1:64 . He was referred to me then and he was NO SHOW for 2 appts. He was referred to [facility] retina specialist Dr. Ashley Jacobs whom he saw on 12/19/20 and he referred him urgently to [facility] . He saw Dr. Enzo Bi on 12/21/20 and he admitted him to the hospital on 12/22/20 for IV penicillin as pt had said he couldn't do IV antibiotics at home. He stayed in the hospital 10/14-10/17 and left AMA on 12/25/20. Pt then showed up to [facility] ED on 10/22 with a PICC line problem- which was resolved by flusing in the ED- there are no notes in care-every where about when the picc was placed and how long he got antibiotics. After calling [facility] pharmacy it looks like patient got IV penicillin 10/19-10/26 Pt had a follow up appt with Dr.Bramson on 01/25/21 But he came to see me on 01/18/21 and wanted to seek care at [facility] close to is home I repeated the rpr and it had increased to 1: 128 from 1:64 inspite of IV antibiotic raising concern that he was not doing it proprely at home I informed him that he has to go back on penicillin and he did not want IV- so we arranged IM procaine penicillin to be given at Day surgery for 14 days along with Po provbenecid which he was supposed to take 500mg  Q6. Pt was totally adherent to the injectiosn- he  took only 3 tablets every day instead of 4   Past h/o cocaine use. Does crystal meth Denies IVDA HIV neg from 12/21/20 at [facility]  Past Medical History:  Diagnosis Date   Bipolar disorder ([place])    Drug abuse ([place])    Dyslipidemia    Hemorrhoids    Hyperlipidemia    Hypertension    PTSD (post-traumatic stress disorder)    Seasonal allergies    Sleep apnea    denies sleep apnea   Tobacco use     Past Surgical History:  Procedure Laterality Date   BACK SURGERY     CHOLECYSTECTOMY     COLONOSCOPY     CYSTECTOMY     arms   FOOT SURGERY Right    HAMMER TOE SURGERY Left 02/21/2017   Procedure: HAMMER TOE CORRECTION X2 left 4th and 5th;  Surgeon: Sharlotte Alamo, DPM;  Location: [facility] ORS;  Service: Podiatry;  Laterality: Left;   KNEE SURGERY Right    cartilege repair    Social History   Socioeconomic History   Marital status: Legally Separated    Spouse name: Not on file   Number of children: Not on file   Years of education: Not on file   Highest education level: Not on file  Occupational History   Not on file  Tobacco Use   Smoking status: Every Day    Packs/day: 1.00    Years: 20.00    Pack years: 20.00    Types: Cigarettes   Smokeless tobacco: Current  Vaping Use   Vaping Use: Never used  Substance and Sexual Activity   Alcohol use: Not Currently    Alcohol/week: 0.0 standard drinks   Drug use: Not Currently    Types: Cocaine, Marijuana   Sexual activity: Not on file  Other Topics Concern   Not on file  Social History Narrative   Not on file   Social Determinants of Health   Financial Resource Strain: Not on file  Food Insecurity: Not on file  Transportation Needs: Not on file  Physical Activity: Not on file  Stress: Not on file  Social Connections: Not on file  Intimate Partner Violence: Not on file    Family History  Problem Relation Age of Onset   Diabetes Mother    Cancer Father    Stroke Father    Allergies  Allergen Reactions    Acetaminophen-Codeine #3 [Acetaminophen-Codeine] Nausea Only   Hydrocodone-Acetaminophen Nausea And Vomiting   I? Current Outpatient Medications  Medication Sig Dispense Refill   Aspirin-Acetaminophen (GOODYS BODY PAIN PO) Take by mouth.     ibuprofen (ADVIL) 800 MG tablet Take 800 mg by mouth every 8 (eight) hours as needed.     Multiple Vitamin (MULTIVITAMIN) tablet Take 1 tablet by mouth daily.     Nutritional Supplements (CLICK ESPRESSO PROTEIN DRINK PO) Take by mouth.     probenecid (BENEMID) 500 MG tablet Take 1 tablet (500 mg total) by mouth every 6 (six) hours. 56 tablet 0   No current facility-administered medications for this visit.     Abtx:  Anti-infectives (From admission, onward)    Start     Dose/Rate Route Frequency Ordered Stop   02/08/21 1230  PENICILLIN G BENZATHINE 1200000 UNIT/2ML IM SUSY        1.2 Million Units Intramuscular  Once 02/08/21 1226 02/08/21 1227   02/08/21 1230  PENICILLIN G BENZATHINE 1200000 UNIT/2ML IM SUSY        1.2 Million Units Intramuscular  Once 02/08/21 1226 02/08/21 1226       REVIEW OF SYSTEMS:  Const: negative fever, negative chills, negative weight loss Eyes: has visual changes, negative eye pain ENT: negative coryza, negative sore throat Poor dentition- hurts and he has dental appt for tooth reomal after he takes a week of amoxicillin Resp: negative cough, hemoptysis, dyspnea Cards: negative for chest pain, palpitations, lower extremity edema GU: negative for frequency, dysuria and hematuria GI: Negative for abdominal pain, diarrhea, bleeding, constipation Skin: negative for rash and pruritus Heme: negative for easy bruising and gum/nose bleeding MS: pain rt knee due to arthritis- sore buttocks Neurolo:negative for headaches, dizziness, vertigo, memory problems  Psych: negative for feelings of anxiety, depression  Endocrine: negative for thyroid, diabetes Allergy/Immunology- as above  Objective:  VITALS:  BP (!) 130/96    Pulse 67   Resp 16   Ht 5\' 9"  (1.753 m)   Wt 185 lb (83.9 kg)   SpO2 98%   BMI 27.32 kg/m  PHYSICAL EXAM:  General: Alert, cooperative, no distress, appears stated age. Disheveled, poor hygiene Head: Normocephalic, without obvious abnormality, atraumatic. Eyes: Conjunctivae clear, anicteric sclerae. Pupils are equal ENT broken teeth , only a few remaining on the lower  Lips, mucosa, and tongue normal. No Thrush Neck: Supple, symmetrical, no adenopathy, thyroid: non tender no carotid  bruit and no JVD. Back: No CVA tenderness. Lungs: Clear to auscultation bilaterally. No Wheezing or Rhonchi. No rales. Heart: Regular rate and rhythm, no murmur, rub or gallop. Abdomen: Soft, non-tender,not distended. Bowel sounds normal. No masses Extremities: atraumatic, no cyanosis. No edema. No clubbing Skin:limited examination Dirty nails Lymph: Cervical, supraclavicular normal. Neurologic: Grossly non-focal  ? Impression/Recommendation ?ocular syphilis- was being treated by [facility] Dr.Bramson- he took  10 days of Iv penicillin-but intermittent and missed few doses) . Spoke to pharmacist Herschel Senegal who said patient got antibiotics 10/14-10/16 and 10/19-10/26 .  As the RPR went upto to 1;128 decided to retreat him with 14 days and he refused PICC and IV and got procaine penicillin IM shots with probenecid He came  to infusion center for daily Im injections Today I will give him benzathine penicillin 2.4 million units today He will get flu vaccine Tdap for the wound on his head he got from his truck   ? ?Bipolar disorder Smoker Substance  use Poor dentition followed by dentist  Follow up 2 months to check RPR ___________________________________________________ Discussed with patient,in great detail

## 2021-02-08 NOTE — Patient Instructions (Signed)
You completed 14days of procaine penicillin IM injections along with probenecid ( which you took three instead of four a day) Today we gave you benzathine peniciilin 2.4 mU- you also got TdaP and Flu vaccine today- will check RPR in March 2023

## 2021-02-15 ENCOUNTER — Ambulatory Visit: Payer: Medicare Other | Admitting: Infectious Diseases

## 2021-05-01 ENCOUNTER — Ambulatory Visit: Payer: Medicare Other | Admitting: Infectious Diseases

## 2021-05-10 ENCOUNTER — Ambulatory Visit: Payer: Medicare Other | Admitting: Infectious Diseases

## 2022-03-07 ENCOUNTER — Other Ambulatory Visit: Payer: Self-pay | Admitting: Physician Assistant

## 2022-03-07 DIAGNOSIS — M5416 Radiculopathy, lumbar region: Secondary | ICD-10-CM

## 2022-03-18 ENCOUNTER — Ambulatory Visit
Admission: RE | Admit: 2022-03-18 | Discharge: 2022-03-18 | Disposition: A | Discharge status: Home / Self Care | Payer: Medicare Other | Source: Ambulatory Visit | Attending: Physician Assistant | Admitting: Physician Assistant

## 2022-03-18 DIAGNOSIS — M5416 Radiculopathy, lumbar region: Secondary | ICD-10-CM | POA: Diagnosis not present

## 2022-03-18 DIAGNOSIS — M5136 Other intervertebral disc degeneration, lumbar region: Secondary | ICD-10-CM | POA: Diagnosis not present

## 2022-03-18 DIAGNOSIS — M48061 Spinal stenosis, lumbar region without neurogenic claudication: Secondary | ICD-10-CM | POA: Diagnosis not present

## 2022-04-15 DIAGNOSIS — M48061 Spinal stenosis, lumbar region without neurogenic claudication: Secondary | ICD-10-CM | POA: Diagnosis not present

## 2022-04-15 DIAGNOSIS — M5416 Radiculopathy, lumbar region: Secondary | ICD-10-CM | POA: Diagnosis not present

## 2022-05-17 NOTE — Progress Notes (Unsigned)
Referring Physician:  Cyndi Bender, PA-C [address] [place],  [place] [address]  Primary Physician:  Pcp, No  History of Present Illness: 05/17/2022*** [name] has a history of anxiety, bipolar, history of drug use (cocaine, marijuana), hyperlipidemia, methamphetamine abuse, and ocular syphilis.        Duration: *** Location: *** Quality: *** Severity: ***  Precipitating: aggravated by *** Modifying factors: made better by *** Weakness: none Timing: *** Bowel/Bladder Dysfunction: none  Conservative measures:  Physical therapy: ***  Multimodal medical therapy including regular antiinflammatories: ***  Injections: *** epidural steroid injections  Past Surgery: ***  Craig Nolan has ***no symptoms of cervical myelopathy.  The symptoms are causing a significant impact on the patient's life.   Review of Systems:  A 10 point review of systems is negative, except for the pertinent positives and negatives detailed in the HPI.  Past Medical History: Past Medical History:  Diagnosis Date   Bipolar disorder ([place])    Drug abuse ([place])    Dyslipidemia    Hemorrhoids    Hyperlipidemia    Hypertension    PTSD (post-traumatic stress disorder)    Seasonal allergies    Sleep apnea    denies sleep apnea   Tobacco use     Past Surgical History: Past Surgical History:  Procedure Laterality Date   BACK SURGERY     CHOLECYSTECTOMY     COLONOSCOPY     CYSTECTOMY     arms   FOOT SURGERY Right    HAMMER TOE SURGERY Left 02/21/2017   Procedure: HAMMER TOE CORRECTION X2 left 4th and 5th;  Surgeon: Sharlotte Alamo, DPM;  Location: [facility] ORS;  Service: Podiatry;  Laterality: Left;   KNEE SURGERY Right    cartilege repair    Allergies: Allergies as of 05/21/2022 - Review Complete 02/08/2021  Allergen Reaction Noted   Acetaminophen-codeine #3 [acetaminophen-codeine] Nausea Only 01/24/2021   Hydrocodone-acetaminophen Nausea And Vomiting 03/27/2017     Medications: Outpatient Encounter Medications as of 05/21/2022  Medication Sig   Aspirin-Acetaminophen (GOODYS BODY PAIN PO) Take by mouth.   ibuprofen (ADVIL) 800 MG tablet Take 800 mg by mouth every 8 (eight) hours as needed.   Multiple Vitamin (MULTIVITAMIN) tablet Take 1 tablet by mouth daily.   Nutritional Supplements (CLICK ESPRESSO PROTEIN DRINK PO) Take by mouth.   probenecid (BENEMID) 500 MG tablet Take 1 tablet (500 mg total) by mouth every 6 (six) hours.   No facility-administered encounter medications on file as of 05/21/2022.    Social History: Social History   Tobacco Use   Smoking status: Every Day    Packs/day: 1.00    Years: 20.00    Total pack years: 20.00    Types: Cigarettes   Smokeless tobacco: Current  Vaping Use   Vaping Use: Never used  Substance Use Topics   Alcohol use: Not Currently    Alcohol/week: 0.0 standard drinks of alcohol   Drug use: Not Currently    Types: Cocaine, Marijuana    Family Medical History: Family History  Problem Relation Age of Onset   Diabetes Mother    Cancer Father    Stroke Father     Physical Examination: There were no vitals filed for this visit.  General: Patient is well developed, well nourished, calm, collected, and in no apparent distress. Attention to examination is appropriate.  Respiratory: Patient is breathing without any difficulty.   NEUROLOGICAL:     Awake, alert, oriented to person, place, and time.  Speech is clear and fluent. Fund of knowledge is appropriate.   Cranial Nerves: Pupils equal round and reactive to light.  Facial tone is symmetric.    *** ROM of cervical spine *** pain *** posterior cervical tenderness. *** tenderness in bilateral trapezial region.   *** ROM of lumbar spine *** pain *** posterior lumbar tenderness.   No abnormal lesions on exposed skin.   Strength: Side Biceps Triceps Deltoid Interossei Grip Wrist Ext. Wrist Flex.  R '5 5 5 5 5 5 5  '$ L '5 5 5 5 5 5 5    '$ Side Iliopsoas Quads Hamstring PF DF EHL  R '5 5 5 5 5 5  '$ L '5 5 5 5 5 5   '$ Reflexes are ***2+ and symmetric at the biceps, triceps, brachioradialis, patella and achilles.   Hoffman's is absent.  Clonus is not present.   Bilateral upper and lower extremity sensation is intact to light touch.     Gait is normal.   ***No difficulty with tandem gait.    Medical Decision Making  Imaging: Lumbar MRI dated 03/18/22:  FINDINGS: Segmentation:  Standard.   Alignment:  Physiologic.   Vertebrae: Degenerative endplate edema at 075-GRM. No evidence of acute fracture, discitis, or aggressive osseous lesion.   Conus medullaris and cauda equina: Conus extends to the T12-L1 level. Conus and cauda equina appear normal.   Paraspinal and other soft tissues: Negative.   Disc levels: There is a degree of congenital spinal canal narrowing on the basis of short pedicles. Multilevel degenerative changes with acquired spinal canal or neural foraminal stenosis as described below.   T12-L1: No significant spinal canal or neural foraminal stenosis   L1-L2: Broad disc bulging with endplate spurring, lymph flavum hypertrophy and mild facet arthropathy. There is also moderate spinal canal stenosis and moderate bilateral neural foraminal stenosis.   L2-L3: Broad disc bulging, ligamentum hypertrophy mild facet arthropathy. Moderate spinal canal stenosis, and moderate bilateral neural foraminal stenosis, right greater than left.   L3-L4: Broad disc bulging, ligamentum flavum hypertrophy and mild facet arthropathy. Severe spinal canal stenosis, moderate-severe left and moderate-severe right neural foraminal stenosis.   L4-L5: Foraminal disc bulging commonly of flavum per trapeze bowel facet arthropathy. Mild spinal canal stenosis. Moderate-severe left and moderate-severe right neural foraminal stenosis.   L5-S1: Broad disc bulging, ligamentum hypertrophy mild facet arthropathy. There is moderate  bilateral subarticular stenosis, and moderate bilateral neural foraminal stenosis.   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 Multilevel degenerative changes of the lumbar spine with acquired stenoses as summarized below:   L1-L2: Moderate spinal canal stenosis. Moderate bilateral neural foraminal stenosis.   L2-L3: Moderate spinal canal stenosis. Moderate bilateral neural foraminal stenosis.   L3-L4: Severe spinal canal stenosis. Moderate-severe bilateral neural foraminal stenosis.   L4-L5: Mild spinal canal stenosis. Moderate-severe bilateral neural foraminal stenosis.   L5-S1: Moderate bilateral subarticular stenosis and neural foraminal stenosis.     Electronically Signed   By: Maurine Simmering M.D.   On: 03/18/2022 19:57  I have personally reviewed the images and agree with the above interpretation.  Assessment and Plan: Craig Nolan is a pleasant 57 y.o. male has ***  Treatment options discussed with patient and following plan made:   - Order for physical therapy for *** spine ***. Patient to call to schedule appointment. *** - Continue current medications including ***. Reviewed dosing and side effects.  - Prescription for ***. Reviewed dosing and side effects. Take  with food.  - Prescription for *** to take prn muscle spasms. Reviewed dosing and side effects. Discussed this can cause drowsiness.  - MRI of *** to further evaluate *** radiculopathy. No improvement time or medications (***).  - Referral to PMR at [facility] to discuss possible *** injections.  - Will schedule phone visit to review MRI results once I get them back.   I spent a total of *** minutes in face-to-face and non-face-to-face activities related to this patient's care today including review of outside records, review of imaging, review of symptoms, physical exam, discussion of differential diagnosis, discussion of treatment options, and documentation.    Thank you for involving me in the care of this patient.   Geronimo Boot PA-C Dept. of Neurosurgery

## 2022-05-21 ENCOUNTER — Encounter: Payer: Self-pay | Admitting: Orthopedic Surgery

## 2022-05-21 ENCOUNTER — Ambulatory Visit (INDEPENDENT_AMBULATORY_CARE_PROVIDER_SITE_OTHER): Payer: 59 | Admitting: Orthopedic Surgery

## 2022-05-21 VITALS — BP 127/82 | Ht 69.0 in | Wt 185.0 lb

## 2022-05-21 DIAGNOSIS — M5416 Radiculopathy, lumbar region: Secondary | ICD-10-CM

## 2022-05-21 DIAGNOSIS — M48061 Spinal stenosis, lumbar region without neurogenic claudication: Secondary | ICD-10-CM

## 2022-05-21 DIAGNOSIS — M4726 Other spondylosis with radiculopathy, lumbar region: Secondary | ICD-10-CM

## 2022-05-21 DIAGNOSIS — M47816 Spondylosis without myelopathy or radiculopathy, lumbar region: Secondary | ICD-10-CM

## 2022-05-21 DIAGNOSIS — Z9889 Other specified postprocedural states: Secondary | ICD-10-CM

## 2022-05-21 MED ORDER — METHYLPREDNISOLONE 4 MG PO TBPK: ORAL_TABLET | ORAL | 0 refills | Status: AC

## 2022-05-21 NOTE — Patient Instructions (Addendum)
It was so nice to see you today. Thank you so much for coming in.    You have some wear and tear in your back along with spinal stenosis (pressure on the spinal cord). I think this is what is causing your back and leg pain.   I sent physical therapy orders to [facility]. You can call them at [phone] if you don't hear from them to schedule your visit.   I sent a prescription for a steroid dose pack to help with pain and inflammation. Take as directed. No Goody's or motrin while on the steroids.   I will see you back in 6 weeks. Please do not hesitate to call if you have any questions or concerns. You can also message me in [place].   Geronimo Boot PA-C [phone]

## 2022-06-11 ENCOUNTER — Telehealth: Payer: Self-pay | Admitting: Neurosurgery

## 2022-06-11 ENCOUNTER — Other Ambulatory Visit: Payer: Self-pay | Admitting: Neurosurgery

## 2022-06-11 NOTE — Telephone Encounter (Signed)
Pt came by the office stating that he was in pain  Says he has finished  methylPREDNISolone (MEDROL DOSEPAK) 4 MG TBPK tablet  And is taking ibuprofen "like candy" but it is not really helping the pain.  Wanted to either get a refill on the dosepak or something else to help with the pain.

## 2022-06-12 NOTE — Telephone Encounter (Signed)
I have left a message for the patient to call back  

## 2022-06-13 ENCOUNTER — Other Ambulatory Visit: Payer: Self-pay | Admitting: Neurosurgery

## 2022-06-13 MED ORDER — METHOCARBAMOL 750 MG PO TABS: 750.0000 mg | ORAL_TABLET | Freq: Three times a day (TID) | ORAL | 0 refills | Status: AC

## 2022-06-13 NOTE — Telephone Encounter (Signed)
I spoke with the patient and he stated that tramadol has not been helpful in the past, but he would like to try a muscle relaxer. Can you please send this into [facility] in [place].

## 2022-06-14 NOTE — Telephone Encounter (Signed)
Left message on pt's voicemail that rx was sent in and to call us if he has any further questions/concerns.

## 2022-06-28 NOTE — Progress Notes (Deleted)
Referring Physician:  Lonie Peak, PA-C [address] [place],  [place] [address]  Primary Physician:  Lonie Peak, PA-C  History of Present Illness: 06/28/2022 [name] has a history of anxiety, bipolar, history of drug use (cocaine, marijuana), hyperlipidemia, methamphetamine abuse, HTN, hyperlipidemia, and ocular syphilis.   History of lumbar surgery in 1983 (discectomy). He improved after surgery and was painfree x years.   Last seen by me on 05/21/22 for back and leg pain. 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 He has severe central stenosis L3-L4. He has moderate/severe bilateral foraminal stenosis L3-L5.   He was sent to PT last visit. He finished dose pack and was still hurting. Robaxin was called in. Injections discussed but he is afraid of needles.   He is here for follow up.          He has constant LBP with constant bilateral anterior leg pain to his mid thigh with occasional radiation to his feet. LBP = leg pain, right leg pain < left leg pain. Pain is chronic, but has been worse in last 6 months. Pain is worse with standing and walking. Grocery cart helps. He has numbness, tingling, and weakness in his legs.       He smokes 1 ppd x 20 years. No current drug use per patient.   Bowel/Bladder Dysfunction: none  Conservative measures:  Physical therapy: [facility] at [facility]?*** Multimodal medical therapy including regular antiinflammatories: Goodys, motrin, mobic  Injections: No epidural steroid injections  Past Surgery: discectomy in 1983  Craig Nolan has no symptoms of cervical myelopathy.  The symptoms are causing a significant impact on the patient's life.   Review of Systems:  A 10 point review of systems is negative, except for the pertinent positives and negatives detailed in the HPI.  Past Medical History: Past Medical History:  Diagnosis Date   Bipolar disorder (HCC)    Drug  abuse (HCC)    Dyslipidemia    Hemorrhoids    Hyperlipidemia    Hypertension    PTSD (post-traumatic stress disorder)    Seasonal allergies    Sleep apnea    denies sleep apnea   Tobacco use     Past Surgical History: Past Surgical History:  Procedure Laterality Date   BACK SURGERY     CHOLECYSTECTOMY     COLONOSCOPY     CYSTECTOMY     arms   FOOT SURGERY Right    HAMMER TOE SURGERY Left 02/21/2017   Procedure: HAMMER TOE CORRECTION X2 left 4th and 5th;  Surgeon: Linus Galas, DPM;  Location: [facility] ORS;  Service: Podiatry;  Laterality: Left;   KNEE SURGERY Right    cartilege repair    Allergies: Allergies as of 07/02/2022 - Review Complete 05/21/2022  Allergen Reaction Noted   Acetaminophen-codeine #3 [acetaminophen-codeine] Nausea Only 01/24/2021   Hydrocodone-acetaminophen Nausea And Vomiting 03/27/2017    Medications: Outpatient Encounter Medications as of 07/02/2022  Medication Sig   methocarbamol (ROBAXIN-750) 750 MG tablet Take 1 tablet (750 mg total) by mouth 3 (three) times daily.   DULoxetine (CYMBALTA) 30 MG capsule Take 30 mg by mouth 2 (two) times daily.   meloxicam (MOBIC) 15 MG tablet Take 15 mg by mouth daily.   methylPREDNISolone (MEDROL DOSEPAK) 4 MG TBPK tablet Use as directed x 6 days.   Multiple Vitamin (MULTIVITAMIN) tablet Take 1 tablet by mouth daily.   No facility-administered encounter medications on file as of 07/02/2022.  Social History: Social History   Tobacco Use   Smoking status: Every Day    Packs/day: 1.00    Years: 20.00    Additional pack years: 0.00    Total pack years: 20.00    Types: Cigarettes   Smokeless tobacco: Current  Vaping Use   Vaping Use: Never used  Substance Use Topics   Alcohol use: Not Currently    Alcohol/week: 0.0 standard drinks of alcohol   Drug use: Not Currently    Types: Cocaine, Marijuana    Family Medical History: Family History  Problem Relation Age of Onset   Diabetes Mother    Cancer  Father    Stroke Father     Physical Examination: There were no vitals filed for this visit.    Awake, alert, oriented to person, place, and time.  Speech is clear and fluent. Fund of knowledge is appropriate.   Cranial Nerves: Pupils equal round and reactive to light.  Facial tone is symmetric.    Limited ROM of lumbar spine with pain Left sided SI joint tenderness. Well healed lumbar incision.    No abnormal lesions on exposed skin.   Strength: Side Biceps Triceps Deltoid Interossei Grip Wrist Ext. Wrist Flex.  R L Side Iliopsoas Quads Hamstring PF DF EHL  R L Reflexes are 2+ and symmetric at the biceps, triceps, brachioradialis, patella and achilles.   Hoffman's is absent.  Clonus is not present.   Bilateral upper and lower extremity sensation is intact to light touch.     Gait is slow.   Medical Decision Making  Imaging: none  Assessment and Plan: [name] is a pleasant 57 y.o. male has a history of lumbar surgery in 1983 (discectomy). He improved after surgery and was painfree x years.   He has chronic constant LBP with constant bilateral anterior leg pain to his mid thigh with occasional radiation to his feet. LBP = leg pain, right leg pain < left leg pain. Pain has been worse in last 6 months. Pain is worse with standing and walking. He has numbness, tingling, and weakness in his legs.   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 He has severe central stenosis L3-L4. He has moderate/severe bilateral foraminal stenosis L3-L5.   Treatment options discussed with patient and following plan made:   - Order for physical therapy for lumbar spine to [facility].  - Prescription for medrol dose pack. He has taken previously with good improvement. Reviewed dosing and side effects. No Goody's or motrin while on dose pack.  - Discussed lumbar injections. He  declines. He hates needles.  - If no improvement with above, may need to discuss surgical options.   I spent a total of 35 minutes in face-to-face and non-face-to-face activities related to this patient's care today including review of outside records, review of imaging, review of symptoms, physical exam, discussion of differential diagnosis, discussion of treatment options, and documentation.   Thank you for involving me in the care of this patient.   Drake Leach PA-C Dept. of Neurosurgery

## 2022-07-02 ENCOUNTER — Ambulatory Visit: Payer: 59 | Admitting: Orthopedic Surgery

## 2022-07-11 NOTE — Progress Notes (Signed)
Referring Physician:  Lonie Peak, PA-C [address] [place],  [place] [address]  Primary Physician:  Lonie Peak, PA-C  History of Present Illness: 07/11/2022 [name] has a history of anxiety, bipolar, history of drug use (cocaine, marijuana), hyperlipidemia, methamphetamine abuse, HTN, hyperlipidemia, and ocular syphilis.   History of lumbar surgery in 1983 (discectomy). He improved after surgery and was painfree x years.   Last seen by me on 05/21/22 for back and leg pain. 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 He has severe central stenosis L3-L4. He has moderate/severe bilateral foraminal stenosis L3-L5.   He was sent to PT last visit. He finished dose pack and was still hurting. Robaxin was called in. Injections discussed but he is afraid of needles.   He is here for follow up.   He did not make it to PT, but is feeling better.   He still has LBP but pain is not as severe. His bilateral leg pain is intermittent and tolerable. Pain is still anterior to his thigh and sometimes into his feet. He still has numbness, tingling, and weakness in his legs.   He had some relief with robaxin and has it to take prn.   He smokes 1 ppd x 20 years.  Bowel/Bladder Dysfunction: none  Conservative measures:  Physical therapy: none Multimodal medical therapy including regular antiinflammatories: Goodys, motrin, mobic  Injections: No epidural steroid injections  Past Surgery: discectomy in 1983  Robet Leu has no symptoms of cervical myelopathy.  The symptoms are causing a significant impact on the patient's life.   Review of Systems:  A 10 point review of systems is negative, except for the pertinent positives and negatives detailed in the HPI.  Past Medical History: Past Medical History:  Diagnosis Date   Bipolar disorder (HCC)    Drug abuse (HCC)    Dyslipidemia    Hemorrhoids    Hyperlipidemia     Hypertension    PTSD (post-traumatic stress disorder)    Seasonal allergies    Sleep apnea    denies sleep apnea   Tobacco use     Past Surgical History: Past Surgical History:  Procedure Laterality Date   BACK SURGERY     CHOLECYSTECTOMY     COLONOSCOPY     CYSTECTOMY     arms   FOOT SURGERY Right    HAMMER TOE SURGERY Left 02/21/2017   Procedure: HAMMER TOE CORRECTION X2 left 4th and 5th;  Surgeon: Linus Galas, DPM;  Location: [facility] ORS;  Service: Podiatry;  Laterality: Left;   KNEE SURGERY Right    cartilege repair    Allergies: Allergies as of 07/18/2022 - Review Complete 05/21/2022  Allergen Reaction Noted   Acetaminophen-codeine #3 [acetaminophen-codeine] Nausea Only 01/24/2021   Hydrocodone-acetaminophen Nausea And Vomiting 03/27/2017    Medications: Outpatient Encounter Medications as of 07/18/2022  Medication Sig   methocarbamol (ROBAXIN-750) 750 MG tablet Take 1 tablet (750 mg total) by mouth 3 (three) times daily.   DULoxetine (CYMBALTA) 30 MG capsule Take 30 mg by mouth 2 (two) times daily.   meloxicam (MOBIC) 15 MG tablet Take 15 mg by mouth daily.   methylPREDNISolone (MEDROL DOSEPAK) 4 MG TBPK tablet Use as directed x 6 days.   Multiple Vitamin (MULTIVITAMIN) tablet Take 1 tablet by mouth daily.   No facility-administered encounter medications on file as of 07/18/2022.    Social History: Social History   Tobacco Use   Smoking status: Every  Day    Packs/day: 1.00    Years: 20.00    Additional pack years: 0.00    Total pack years: 20.00    Types: Cigarettes   Smokeless tobacco: Current  Vaping Use   Vaping Use: Never used  Substance Use Topics   Alcohol use: Not Currently    Alcohol/week: 0.0 standard drinks of alcohol   Drug use: Not Currently    Types: Cocaine, Marijuana    Family Medical History: Family History  Problem Relation Age of Onset   Diabetes Mother    Cancer Father    Stroke Father     Physical Examination: There were no  vitals filed for this visit.    Awake, alert, oriented to person, place, and time.  Speech is clear and fluent. Fund of knowledge is appropriate.   Cranial Nerves: Pupils equal round and reactive to light.  Facial tone is symmetric.    No abnormal lesions on exposed skin.   Strength:  Side Iliopsoas Quads Hamstring PF DF EHL  R 5 5 5 5 5 5   L 5 5 5 5 5 5    Reflexes are 2+ and symmetric at the patella and achilles.     Bilateral lower extremity sensation is intact to light touch.     Gait is slow.   Medical Decision Making  Imaging: none  Assessment and Plan: [name] is a pleasant 57 y.o. male has a history of lumbar surgery in 1983 (discectomy). He improved after surgery and was painfree x years.   He has seen improvement since his last visit when he was in a flare up. Sounds like he is back to his baseline.   He has chronic constant LBP, but it is more tolerable. He has intermittent bilateral anterior leg pain to his mid thigh with occasional radiation to his feet. Leg pain is tolerable.   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 He has severe central stenosis L3-L4. He has moderate/severe bilateral foraminal stenosis L3-L5.   Treatment options discussed with patient and following plan made:   - He declines PT for now.  - Discussed lumbar injections. He declines. He hates needles.  - If he has another flare up, he has robaxin to use prn. Can also repeat medrol dose pack.  - He will f/u prn at his request.   I spent a total of 15 minutes in face-to-face and non-face-to-face activities related to this patient's care today including review of outside records, review of imaging, review of symptoms, physical exam, discussion of differential diagnosis, discussion of treatment options, and documentation.   Thank you for involving me in the care of this patient.   Drake Leach PA-C Dept. of Neurosurgery

## 2022-07-18 ENCOUNTER — Ambulatory Visit (INDEPENDENT_AMBULATORY_CARE_PROVIDER_SITE_OTHER): Payer: 59 | Admitting: Orthopedic Surgery

## 2022-07-18 ENCOUNTER — Encounter: Payer: Self-pay | Admitting: Orthopedic Surgery

## 2022-07-18 VITALS — BP 132/80 | Ht 69.0 in | Wt 185.0 lb

## 2022-07-18 DIAGNOSIS — M47816 Spondylosis without myelopathy or radiculopathy, lumbar region: Secondary | ICD-10-CM

## 2022-07-18 DIAGNOSIS — M48061 Spinal stenosis, lumbar region without neurogenic claudication: Secondary | ICD-10-CM | POA: Diagnosis not present

## 2022-07-18 DIAGNOSIS — Z9889 Other specified postprocedural states: Secondary | ICD-10-CM

## 2022-07-18 DIAGNOSIS — M5416 Radiculopathy, lumbar region: Secondary | ICD-10-CM

## 2022-07-18 DIAGNOSIS — M4726 Other spondylosis with radiculopathy, lumbar region: Secondary | ICD-10-CM | POA: Diagnosis not present

## 2022-10-10 DIAGNOSIS — D179 Benign lipomatous neoplasm, unspecified: Secondary | ICD-10-CM | POA: Diagnosis not present

## 2022-10-25 ENCOUNTER — Other Ambulatory Visit: Payer: Self-pay | Admitting: Neurosurgery

## 2022-10-25 DIAGNOSIS — D1721 Benign lipomatous neoplasm of skin and subcutaneous tissue of right arm: Secondary | ICD-10-CM | POA: Diagnosis not present

## 2022-10-25 DIAGNOSIS — D1723 Benign lipomatous neoplasm of skin and subcutaneous tissue of right leg: Secondary | ICD-10-CM | POA: Diagnosis not present

## 2022-10-25 DIAGNOSIS — I10 Essential (primary) hypertension: Secondary | ICD-10-CM | POA: Diagnosis not present

## 2022-10-25 DIAGNOSIS — D1722 Benign lipomatous neoplasm of skin and subcutaneous tissue of left arm: Secondary | ICD-10-CM | POA: Diagnosis not present

## 2022-11-20 DIAGNOSIS — D179 Benign lipomatous neoplasm, unspecified: Secondary | ICD-10-CM | POA: Diagnosis not present

## 2022-11-20 DIAGNOSIS — Z09 Encounter for follow-up examination after completed treatment for conditions other than malignant neoplasm: Secondary | ICD-10-CM | POA: Diagnosis not present

## 2023-01-30 DIAGNOSIS — M25551 Pain in right hip: Secondary | ICD-10-CM | POA: Diagnosis not present
